# Patient Record
Sex: Female | Born: 1950 | Race: White | Hispanic: No | Marital: Married | Smoking: Never smoker
Health system: Southern US, Community
[De-identification: ages and names within clinical notes are randomized; demographics above are authoritative.]

## PROBLEM LIST (undated history)

## (undated) HISTORY — PX: ABDOMINAL HYSTERECTOMY: SUR658

---

## 2020-06-14 ENCOUNTER — Encounter: Payer: Self-pay | Admitting: Osteopathic Medicine

## 2020-06-14 ENCOUNTER — Other Ambulatory Visit: Payer: Self-pay

## 2020-06-14 ENCOUNTER — Ambulatory Visit (INDEPENDENT_AMBULATORY_CARE_PROVIDER_SITE_OTHER): Payer: MEDICARE | Admitting: Osteopathic Medicine

## 2020-06-14 VITALS — BP 120/64 | HR 59 | Temp 98.2°F | Ht 63.0 in | Wt 231.1 lb

## 2020-06-14 DIAGNOSIS — E039 Hypothyroidism, unspecified: Secondary | ICD-10-CM | POA: Diagnosis not present

## 2020-06-14 DIAGNOSIS — Z8673 Personal history of transient ischemic attack (TIA), and cerebral infarction without residual deficits: Secondary | ICD-10-CM | POA: Insufficient documentation

## 2020-06-14 DIAGNOSIS — Z794 Long term (current) use of insulin: Secondary | ICD-10-CM

## 2020-06-14 DIAGNOSIS — E119 Type 2 diabetes mellitus without complications: Secondary | ICD-10-CM

## 2020-06-14 DIAGNOSIS — R053 Chronic cough: Secondary | ICD-10-CM

## 2020-06-14 DIAGNOSIS — E1169 Type 2 diabetes mellitus with other specified complication: Secondary | ICD-10-CM

## 2020-06-14 DIAGNOSIS — R05 Cough: Secondary | ICD-10-CM

## 2020-06-14 DIAGNOSIS — E1122 Type 2 diabetes mellitus with diabetic chronic kidney disease: Secondary | ICD-10-CM

## 2020-06-14 DIAGNOSIS — Z90710 Acquired absence of both cervix and uterus: Secondary | ICD-10-CM

## 2020-06-14 DIAGNOSIS — K219 Gastro-esophageal reflux disease without esophagitis: Secondary | ICD-10-CM | POA: Diagnosis not present

## 2020-06-14 DIAGNOSIS — L578 Other skin changes due to chronic exposure to nonionizing radiation: Secondary | ICD-10-CM

## 2020-06-14 DIAGNOSIS — Z1231 Encounter for screening mammogram for malignant neoplasm of breast: Secondary | ICD-10-CM

## 2020-06-14 DIAGNOSIS — R5382 Chronic fatigue, unspecified: Secondary | ICD-10-CM | POA: Insufficient documentation

## 2020-06-14 DIAGNOSIS — I10 Essential (primary) hypertension: Secondary | ICD-10-CM

## 2020-06-14 DIAGNOSIS — E785 Hyperlipidemia, unspecified: Secondary | ICD-10-CM

## 2020-06-14 DIAGNOSIS — R239 Unspecified skin changes: Secondary | ICD-10-CM

## 2020-06-14 HISTORY — DX: Gastro-esophageal reflux disease without esophagitis: K21.9

## 2020-06-14 HISTORY — DX: Essential (primary) hypertension: I10

## 2020-06-14 HISTORY — DX: Hypothyroidism, unspecified: E03.9

## 2020-06-14 HISTORY — DX: Type 2 diabetes mellitus without complications: E11.9

## 2020-06-14 HISTORY — DX: Acquired absence of both cervix and uterus: Z90.710

## 2020-06-14 HISTORY — DX: Type 2 diabetes mellitus with other specified complication: E11.69

## 2020-06-14 LAB — CBC
HCT: 41.7 % (ref 35.0–45.0)
Hemoglobin: 13.7 g/dL (ref 11.7–15.5)
MCH: 30 pg (ref 27.0–33.0)
MCHC: 32.9 g/dL (ref 32.0–36.0)
MCV: 91.4 fL (ref 80.0–100.0)
MPV: 9.6 fL (ref 7.5–12.5)
Platelets: 290 10*3/uL (ref 140–400)
RBC: 4.56 10*6/uL (ref 3.80–5.10)
RDW: 13.4 % (ref 11.0–15.0)
WBC: 6.8 10*3/uL (ref 3.8–10.8)

## 2020-06-14 LAB — HEMOGLOBIN A1C
Hgb A1c MFr Bld: 5.6 % of total Hgb (ref <5.7)
Mean Plasma Glucose: 114 (calc)
eAG (mmol/L): 6.3 (calc)

## 2020-06-14 LAB — COMPLETE METABOLIC PANEL WITH GFR
AG Ratio: 1.3 (calc) (ref 1.0–2.5)
ALT: 18 U/L (ref 6–29)
AST: 15 U/L (ref 10–35)
Albumin: 3.8 g/dL (ref 3.6–5.1)
Alkaline phosphatase (APISO): 110 U/L (ref 37–153)
BUN/Creatinine Ratio: 16 (calc) (ref 6–22)
BUN: 31 mg/dL — ABNORMAL HIGH (ref 7–25)
CO2: 22 mmol/L (ref 20–32)
Calcium: 9.6 mg/dL (ref 8.6–10.4)
Chloride: 105 mmol/L (ref 98–110)
Creat: 1.91 mg/dL — ABNORMAL HIGH (ref 0.60–0.93)
GFR, Est African American: 30 mL/min/{1.73_m2} — ABNORMAL LOW (ref >=60)
GFR, Est Non African American: 26 mL/min/{1.73_m2} — ABNORMAL LOW (ref >=60)
Globulin: 2.9 g/dL (calc) (ref 1.9–3.7)
Glucose, Bld: 144 mg/dL — ABNORMAL HIGH (ref 65–139)
Potassium: 4.3 mmol/L (ref 3.5–5.3)
Sodium: 139 mmol/L (ref 135–146)
Total Bilirubin: 0.5 mg/dL (ref 0.2–1.2)
Total Protein: 6.7 g/dL (ref 6.1–8.1)

## 2020-06-14 LAB — VITAMIN D 25 HYDROXY (VIT D DEFICIENCY, FRACTURES): Vit D, 25-Hydroxy: 28 ng/mL — ABNORMAL LOW (ref 30–100)

## 2020-06-14 LAB — LIPID PANEL
Cholesterol: 112 mg/dL (ref <200)
HDL: 44 mg/dL — ABNORMAL LOW (ref >=50)
LDL Cholesterol (Calc): 50 mg/dL (calc)
Non-HDL Cholesterol (Calc): 68 mg/dL (calc) (ref <130)
Total CHOL/HDL Ratio: 2.5 (calc) (ref <5.0)
Triglycerides: 95 mg/dL (ref <150)

## 2020-06-14 LAB — PHOSPHORUS: Phosphorus: 3.6 mg/dL (ref 2.1–4.3)

## 2020-06-14 LAB — TSH: TSH: 1 mIU/L (ref 0.40–4.50)

## 2020-06-14 MED ORDER — OMEPRAZOLE 40 MG PO CPDR: 40.0000 mg | DELAYED_RELEASE_CAPSULE | Freq: Every day | ORAL | 0 refills | Status: DC

## 2020-06-14 NOTE — Progress Notes (Signed)
Marguerite Barba is a 69 y.o. female who presents to  [place] at [facility]  today, 06/14/20, seeking care for the following:  . Establish care  . Reports chronic conditions are stable, see below . Reports intermittent chronic dry cough for greater than 6 months, non-smoker, no history of occupational exposures to dust or fumes.  Does have some significant GERD symptoms bothering her from time to time but for the most part fairly well controlled, no allergies/postnasal drip problems. . Concern for some skin spots on both lower extremities.  Significant history of sun exposure. Marland Kitchen Pending records from previous PCP      ASSESSMENT & PLAN with other pertinent findings:  The primary encounter diagnosis was Essential hypertension. Diagnoses of Hyperlipidemia associated with type 2 diabetes mellitus ([place]), Gastroesophageal reflux disease, unspecified whether esophagitis present, Type 2 diabetes mellitus with other specified complication, with long-term current use of insulin ([place]), Hypothyroidism, unspecified type, Persistent dry cough, Skin change, Chronic fatigue, History of hysterectomy for benign disease, History of ischemic stroke, Type 2 diabetes mellitus with chronic kidney disease, with long-term current use of insulin, unspecified CKD stage ([place]), Sun-damaged skin, and Breast cancer screening by mammogram were also pertinent to this visit.       Patient Instructions  Plan: --> labs today to monitor kidneys, sugars, and to evaluate potential causes of fatigue --> if labs all ok, might consider repeating sleep study to assess for possible sleep apnea  --> adding Omeprazole antacid to take for 6-8 weeks to see if this helps the cough. Lungs sound great, but if cough no better, we should get further lung testing --> will get set up for routine mammogram --> referral to dermatology for skin check. If they can't get you in within the next month or  so, we can do biopsy here in the office for the concerning spots on the lower leg       Orders Placed This Encounter  Procedures  . MM 3D SCREEN BREAST BILATERAL  . CBC  . COMPLETE METABOLIC PANEL WITH GFR  . Lipid panel  . TSH  . Hemoglobin A1c  . Urinalysis, Routine w reflex microscopic  . VITAMIN D 25 Hydroxy (Vit-D Deficiency, Fractures)  . Phosphorus  . Ambulatory referral to Dermatology    Meds ordered this encounter  Medications  . omeprazole (PRILOSEC) 40 MG capsule    Sig: Take 1 capsule (40 mg total) by mouth daily.    Dispense:  90 capsule    Refill:  0       Follow-up instructions: Return in about 6 months (around 12/15/2020) for ROUTINE CHECK-UP AND LABS. SEE ME SOONER IF COUGH NOT BETTER, OR IF DERMATOLOGY UNABLE TO SEE YOU.                                         BP 120/64 (BP Location: Left Arm, Patient Position: Sitting, Cuff Size: Large)   Pulse (!) 59   Temp 98.2 F (36.8 C) (Oral)   Ht 5\' 3"  (1.6 m)   Wt 231 lb 1.9 oz (104.8 kg)   SpO2 98%   BMI 40.94 kg/m   Current Meds  Medication Sig  . amLODipine (NORVASC) 10 MG tablet Take 10 mg by mouth daily.  Marland Kitchen aspirin (ASPIRIN ADULT LOW DOSE) 81 MG EC tablet Take 81 mg by mouth daily. Swallow whole.  Marland Kitchen  atorvastatin (LIPITOR) 80 MG tablet Take 80 mg by mouth daily.  . clopidogrel (PLAVIX) 75 MG tablet Take 75 mg by mouth daily.  . famotidine (PEPCID) 20 MG tablet Take 20 mg by mouth 2 (two) times daily.  . hydrALAZINE (APRESOLINE) 50 MG tablet Take 50 mg by mouth 3 (three) times daily.  . insulin glargine, 2 Unit Dial, (TOUJEO MAX SOLOSTAR) 300 UNIT/ML Solostar Pen Inject 50 Units into the skin.  Marland Kitchen insulin lispro (HUMALOG) 100 UNIT/ML injection Inject into the skin 3 (three) times daily before meals.  Marland Kitchen levothyroxine (SYNTHROID) 137 MCG tablet Take 137 mcg by mouth daily before breakfast.  . lisinopril (ZESTRIL) 40 MG tablet Take 40 mg by mouth daily.  .  metoprolol tartrate (LOPRESSOR) 50 MG tablet Take 50 mg by mouth 2 (two) times daily.  . pioglitazone (ACTOS) 15 MG tablet Take 15 mg by mouth daily.  Marland Kitchen spironolactone (ALDACTONE) 50 MG tablet Take 50 mg by mouth daily.    No results found for this or any previous visit (from the past 72 hour(s)).  No results found.     All questions at time of visit were answered - patient instructed to contact office with any additional concerns or updates.  ER/RTC precautions were reviewed with the patient as applicable.   Please note: voice recognition software was used to produce this document, and typos may escape review. Please contact Dr. Sheppard Coil for any needed clarifications.   Total time spent 60 mins

## 2020-06-14 NOTE — Patient Instructions (Signed)
Plan: --> labs today to monitor kidneys, sugars, and to evaluate potential causes of fatigue --> if labs all ok, might consider repeating sleep study to assess for possible sleep apnea  --> adding Omeprazole antacid to take for 6-8 weeks to see if this helps the cough. Lungs sound great, but if cough no better, we should get further lung testing --> will get set up for routine mammogram --> referral to dermatology for skin check. If they can't get you in within the next month or so, we can do biopsy here in the office for the concerning spots on the lower leg

## 2020-06-16 LAB — URINALYSIS, ROUTINE W REFLEX MICROSCOPIC
Bilirubin Urine: NEGATIVE
Glucose, UA: NEGATIVE
Hgb urine dipstick: NEGATIVE
Ketones, ur: NEGATIVE
Nitrite: POSITIVE — AB
Protein, ur: NEGATIVE
Specific Gravity, Urine: 1.016 (ref 1.001–1.03)
pH: 5.5 (ref 5.0–8.0)

## 2020-06-16 MED ORDER — NITROFURANTOIN MONOHYD MACRO 100 MG PO CAPS: 100.0000 mg | ORAL_CAPSULE | Freq: Two times a day (BID) | ORAL | 0 refills | Status: DC

## 2020-06-16 NOTE — Addendum Note (Signed)
Addended by: Maryla Morrow on: 06/16/2020 02:01 PM   Modules accepted: Orders

## 2020-06-21 ENCOUNTER — Encounter: Payer: Self-pay | Admitting: Osteopathic Medicine

## 2020-06-22 NOTE — Telephone Encounter (Signed)
Adriana Gibson looking into this the patient told Derm that Dr. Loni Muse would do the biopsy for them so as of now it doesn't look like they are wanting referral sent anywhere else.  Then Nephrology referral has not been placed yet and I can not put referrals in. When it is ordered I will be happy to send it to where she wants. - [facility]

## 2020-06-29 ENCOUNTER — Ambulatory Visit (INDEPENDENT_AMBULATORY_CARE_PROVIDER_SITE_OTHER): Payer: MEDICARE

## 2020-06-29 ENCOUNTER — Other Ambulatory Visit: Payer: Self-pay

## 2020-06-29 DIAGNOSIS — Z1231 Encounter for screening mammogram for malignant neoplasm of breast: Secondary | ICD-10-CM

## 2020-07-24 ENCOUNTER — Other Ambulatory Visit: Payer: Self-pay

## 2020-07-24 DIAGNOSIS — E039 Hypothyroidism, unspecified: Secondary | ICD-10-CM

## 2020-07-24 MED ORDER — LEVOTHYROXINE SODIUM 137 MCG PO TABS: 137.0000 ug | ORAL_TABLET | Freq: Every day | ORAL | 3 refills | Status: DC

## 2020-07-25 ENCOUNTER — Other Ambulatory Visit: Payer: Self-pay | Admitting: Osteopathic Medicine

## 2020-07-25 DIAGNOSIS — R928 Other abnormal and inconclusive findings on diagnostic imaging of breast: Secondary | ICD-10-CM

## 2020-08-16 ENCOUNTER — Other Ambulatory Visit: Payer: Self-pay | Admitting: Osteopathic Medicine

## 2020-08-16 NOTE — Telephone Encounter (Signed)
Rx written by historical provider. 

## 2020-08-21 ENCOUNTER — Other Ambulatory Visit: Payer: Self-pay | Admitting: Osteopathic Medicine

## 2020-08-21 NOTE — Telephone Encounter (Signed)
[facility] requesting med refill for pioglitazone. Written by historical provider.

## 2020-09-05 ENCOUNTER — Other Ambulatory Visit: Payer: Self-pay | Admitting: Osteopathic Medicine

## 2020-09-13 ENCOUNTER — Telehealth: Payer: Self-pay

## 2020-09-13 NOTE — Telephone Encounter (Signed)
[facility] requesting med refills for spironolactone. Written by historical provider.

## 2020-09-15 MED ORDER — SPIRONOLACTONE 50 MG PO TABS: 50.0000 mg | ORAL_TABLET | Freq: Every day | ORAL | 1 refills | Status: DC

## 2020-09-17 DIAGNOSIS — E559 Vitamin D deficiency, unspecified: Secondary | ICD-10-CM | POA: Insufficient documentation

## 2020-09-17 DIAGNOSIS — N184 Chronic kidney disease, stage 4 (severe): Secondary | ICD-10-CM | POA: Insufficient documentation

## 2020-09-19 ENCOUNTER — Other Ambulatory Visit: Payer: Self-pay

## 2020-09-19 MED ORDER — TOUJEO MAX SOLOSTAR 300 UNIT/ML ~~LOC~~ SOPN: 50.0000 [IU] | PEN_INJECTOR | Freq: Every day | SUBCUTANEOUS | 5 refills | Status: DC

## 2020-09-19 MED ORDER — LISINOPRIL 40 MG PO TABS
40.0000 mg | ORAL_TABLET | Freq: Every day | ORAL | 3 refills | Status: DC
Start: 2020-09-19 — End: 2021-06-01

## 2020-09-19 NOTE — Telephone Encounter (Signed)
[facility] requesting med refill for lisinopril and toujeo. Both rxs written by historical provider. Rxs pended.

## 2020-09-30 ENCOUNTER — Ambulatory Visit
Admission: RE | Admit: 2020-09-30 | Discharge: 2020-09-30 | Disposition: A | Discharge status: Home / Self Care | Payer: MEDICARE | Source: Ambulatory Visit | Attending: Osteopathic Medicine | Admitting: Osteopathic Medicine

## 2020-09-30 ENCOUNTER — Other Ambulatory Visit: Payer: Self-pay

## 2020-09-30 ENCOUNTER — Other Ambulatory Visit: Payer: MEDICARE

## 2020-09-30 DIAGNOSIS — R928 Other abnormal and inconclusive findings on diagnostic imaging of breast: Secondary | ICD-10-CM

## 2020-10-26 ENCOUNTER — Other Ambulatory Visit: Payer: Self-pay | Admitting: Osteopathic Medicine

## 2020-10-30 ENCOUNTER — Ambulatory Visit (INDEPENDENT_AMBULATORY_CARE_PROVIDER_SITE_OTHER): Payer: MEDICARE | Admitting: Family Medicine

## 2020-10-30 DIAGNOSIS — Z0000 Encounter for general adult medical examination without abnormal findings: Secondary | ICD-10-CM | POA: Diagnosis not present

## 2020-10-30 NOTE — Patient Instructions (Addendum)
Bone Density Test The bone density test uses a special type of X-ray to measure the amount of calcium and other minerals in your bones. It can measure bone density in the hip and the spine. The test procedure is similar to having a regular X-ray. This test may also be called:  Bone densitometry.  Bone mineral density test.  Dual-energy X-ray absorptiometry (DEXA). You may have this test to:  Diagnose a condition that causes weak or thin bones (osteoporosis).  Screen you for osteoporosis.  Predict your risk for a broken bone (fracture).  Determine how well your osteoporosis treatment is working. Tell a health care provider about:  Any allergies you have.  All medicines you are taking, including vitamins, herbs, eye drops, creams, and over-the-counter medicines.  Any problems you or family members have had with anesthetic medicines.  Any blood disorders you have.  Any surgeries you have had.  Any medical conditions you have.  Whether you are pregnant or may be pregnant.  Any medical tests you have had within the past 14 days that used contrast material. What are the risks? Generally, this is a safe procedure. However, it does expose you to a small amount of radiation, which can slightly increase your cancer risk. What happens before the procedure?  Do not take any calcium supplements starting 24 hours before your test.  Remove all metal jewelry, eyeglasses, dental appliances, and any other metal objects. What happens during the procedure?   You will lie down on an exam table. There will be an X-ray generator below you and an imaging device above you.  Other devices, such as boxes or braces, may be used to position your body properly for the scan.  The machine will slowly scan your body. You will need to keep still.  The images will show up on a screen in the room. Images will be examined by a specialist after your test is done. The procedure may vary among health  care providers and hospitals. What happens after the procedure?  It is up to you to get your test results. Ask your health care provider, or the department that is doing the test, when your results will be ready. Summary  A bone density test is an imaging test that uses a type of X-ray to measure the amount of calcium and other minerals in your bones.  The test may be used to diagnose or screen you for a condition that causes weak or thin bones (osteoporosis), predict your risk for a broken bone (fracture), or determine how well your osteoporosis treatment is working.  Do not take any calcium supplements starting 24 hours before your test.  Ask your health care provider, or the department that is doing the test, when your results will be ready. This information is not intended to replace advice given to you by your health care provider. Make sure you discuss any questions you have with your health care provider. Document Revised: 10/30/2017 Document Reviewed: 08/18/2017 Elsevier Patient Education  [place].   Colonoscopy, Adult A colonoscopy is a procedure to look at the entire large intestine. This procedure is done using a long, thin, flexible tube that has a camera on the end. You may have a colonoscopy:  As a part of normal colorectal screening.  If you have certain symptoms, such as: ? A low number of red blood cells in your blood (anemia). ? Diarrhea that does not go away. ? Pain in your abdomen. ? Blood in  your stool. A colonoscopy can help screen for and diagnose medical problems, including:  Tumors.  Extra tissue that grows where mucus forms (polyps).  Inflammation.  Areas of bleeding. Tell your health care provider about:  Any allergies you have.  All medicines you are taking, including vitamins, herbs, eye drops, creams, and over-the-counter medicines.  Any problems you or family members have had with anesthetic medicines.  Any blood disorders you  have.  Any surgeries you have had.  Any medical conditions you have.  Any problems you have had with having bowel movements.  Whether you are pregnant or may be pregnant. What are the risks? Generally, this is a safe procedure. However, problems may occur, including:  Bleeding.  Damage to your intestine.  Allergic reactions to medicines given during the procedure.  Infection. This is rare. What happens before the procedure? Eating and drinking restrictions Follow instructions from your health care provider about eating or drinking restrictions, which may include:  A few days before the procedure: ? Follow a low-fiber diet. ? Avoid nuts, seeds, dried fruit, raw fruits, and vegetables.  1-3 days before the procedure: ? Eat only gelatin dessert or ice pops. ? Drink only clear liquids, such as water, clear juice, clear broth or bouillon, black coffee or tea, or clear soft drinks or sports drinks. ? Avoid liquids that contain red or purple dye.  The day of the procedure: ? Do not eat solid foods. You may continue to drink clear liquids until up to 2 hours before the procedure. ? Do not eat or drink anything starting 2 hours before the procedure, or within the time period that your health care provider recommends. Bowel prep If you were prescribed a bowel prep to take by mouth (orally) to clean out your colon:  Take it as told by your health care provider. Starting the day before your procedure, you will need to drink a large amount of liquid medicine. The liquid will cause you to have many bowel movements of loose stool until your stool becomes almost clear or light green.  If your skin or the opening between the buttocks (anus) gets irritated from diarrhea, you may relieve the irritation using: ? Wipes with medicine in them, such as adult wet wipes with aloe and vitamin E. ? A product to soothe skin, such as petroleum jelly.  If you vomit while drinking the bowel prep: ? Take  a break for up to 60 minutes. ? Begin the bowel prep again. ? Call your health care provider if you keep vomiting or you cannot take the bowel prep without vomiting.  To clean out your colon, you may also be given: ? Laxative medicines. These help you have a bowel movement. ? Instructions for enema use. An enema is liquid medicine injected into your rectum. Medicines Ask your health care provider about:  Changing or stopping your regular medicines or supplements. This is especially important if you are taking iron supplements, diabetes medicines, or blood thinners.  Taking medicines such as aspirin and ibuprofen. These medicines can thin your blood. Do not take these medicines unless your health care provider tells you to take them.  Taking over-the-counter medicines, vitamins, herbs, and supplements. General instructions  Ask your health care provider what steps will be taken to help prevent infection. These may include washing skin with a germ-killing soap.  Plan to have someone take you home from the hospital or clinic. What happens during the procedure?   An IV will be inserted  into one of your veins.  You may be given one or more of the following: ? A medicine to help you relax (sedative). ? A medicine to numb the area (local anesthetic). ? A medicine to make you fall asleep (general anesthetic). This is rarely needed.  You will lie on your side with your knees bent.  The tube will: ? Have oil or gel put on it (be lubricated). ? Be inserted into your anus. ? Be gently eased through all parts of your large intestine.  Air will be sent into your colon to keep it open. This may cause some pressure or cramping.  Images will be taken with the camera and will appear on a screen.  A small tissue sample may be removed to be looked at under a microscope (biopsy). The tissue may be sent to a lab for testing if any signs of problems are found.  If small polyps are found, they may  be removed and checked for cancer cells.  When the procedure is finished, the tube will be removed. The procedure may vary among health care providers and hospitals. What happens after the procedure?  Your blood pressure, heart rate, breathing rate, and blood oxygen level will be monitored until you leave the hospital or clinic.  You may have a small amount of blood in your stool.  You may pass gas and have mild cramping or bloating in your abdomen. This is caused by the air that was used to open your colon during the exam.  Do not drive for 24 hours after the procedure.  It is up to you to get the results of your procedure. Ask your health care provider, or the department that is doing the procedure, when your results will be ready. Summary  A colonoscopy is a procedure to look at the entire large intestine.  Follow instructions from your health care provider about eating and drinking before the procedure.  If you were prescribed an oral bowel prep to clean out your colon, take it as told by your health care provider.  During the colonoscopy, a flexible tube with a camera on its end is inserted into the anus and then passed into the other parts of the large intestine. This information is not intended to replace advice given to you by your health care provider. Make sure you discuss any questions you have with your health care provider. Document Revised: 05/07/2019 Document Reviewed: 05/07/2019 Elsevier Patient Education  [place] and Cholesterol Restricted Eating Plan Getting too much fat and cholesterol in your diet may cause health problems. Choosing the right foods helps keep your fat and cholesterol at normal levels. This can keep you from getting certain diseases. Your doctor may recommend an eating plan that includes:  Total fat: ______% or less of total calories a day.  Saturated fat: ______% or less of total calories a day.  Cholesterol: less than  _________mg a day.  Fiber: ______g a day. What are tips for following this plan? Meal planning  At meals, divide your plate into four equal parts: ? Fill one-half of your plate with vegetables and green salads. ? Fill one-fourth of your plate with whole grains. ? Fill one-fourth of your plate with low-fat (lean) protein foods.  Eat fish that is high in omega-3 fats at least two times a week. This includes mackerel, tuna, sardines, and salmon.  Eat foods that are high in fiber, such as whole grains, beans, apples, broccoli, carrots,  peas, and barley. General tips   Work with your doctor to lose weight if you need to.  Avoid: ? Foods with added sugar. ? Fried foods. ? Foods with partially hydrogenated oils.  Limit alcohol intake to no more than 1 drink a day for nonpregnant women and 2 drinks a day for men. One drink equals 12 oz of beer, 5 oz of wine, or 1 oz of hard liquor. Reading food labels  Check food labels for: ? Trans fats. ? Partially hydrogenated oils. ? Saturated fat (g) in each serving. ? Cholesterol (mg) in each serving. ? Fiber (g) in each serving.  Choose foods with healthy fats, such as: ? Monounsaturated fats. ? Polyunsaturated fats. ? Omega-3 fats.  Choose grain products that have whole grains. Look for the word "whole" as the first word in the ingredient list. Cooking  Cook foods using low-fat methods. These include baking, boiling, grilling, and broiling.  Eat more home-cooked foods. Eat at restaurants and buffets less often.  Avoid cooking using saturated fats, such as butter, cream, palm oil, palm kernel oil, and coconut oil. Recommended foods  Fruits  All fresh, canned (in natural juice), or frozen fruits. Vegetables  Fresh or frozen vegetables (raw, steamed, roasted, or grilled). Green salads. Grains  Whole grains, such as whole wheat or whole grain breads, crackers, cereals, and pasta. Unsweetened oatmeal, bulgur, barley, quinoa, or  brown rice. Corn or whole wheat flour tortillas. Meats and other protein foods  Ground beef (85% or leaner), grass-fed beef, or beef trimmed of fat. Skinless chicken or [place]. Ground chicken or [place]. Pork trimmed of fat. All fish and seafood. Egg whites. Dried beans, peas, or lentils. Unsalted nuts or seeds. Unsalted canned beans. Nut butters without added sugar or oil. Dairy  Low-fat or nonfat dairy products, such as skim or 1% milk, 2% or reduced-fat cheeses, low-fat and fat-free ricotta or cottage cheese, or plain low-fat and nonfat yogurt. Fats and oils  Tub margarine without trans fats. Light or reduced-fat mayonnaise and salad dressings. Avocado. Olive, canola, sesame, or safflower oils. The items listed above may not be a complete list of foods and beverages you can eat. Contact a dietitian for more information. Foods to avoid Fruits  Canned fruit in heavy syrup. Fruit in cream or butter sauce. Fried fruit. Vegetables  Vegetables cooked in cheese, cream, or butter sauce. Fried vegetables. Grains  White bread. White pasta. White rice. Cornbread. Bagels, pastries, and croissants. Crackers and snack foods that contain trans fat and hydrogenated oils. Meats and other protein foods  Fatty cuts of meat. Ribs, chicken wings, bacon, sausage, bologna, salami, chitterlings, fatback, hot dogs, bratwurst, and packaged lunch meats. Liver and organ meats. Whole eggs and egg yolks. Chicken and [place] with skin. Fried meat. Dairy  Whole or 2% milk, cream, half-and-half, and cream cheese. Whole milk cheeses. Whole-fat or sweetened yogurt. Full-fat cheeses. Nondairy creamers and whipped toppings. Processed cheese, cheese spreads, and cheese curds. Beverages  Alcohol. Sugar-sweetened drinks such as sodas, lemonade, and fruit drinks. Fats and oils  Butter, stick margarine, lard, shortening, ghee, or bacon fat. Coconut, palm kernel, and palm oils. Sweets and desserts  Corn syrup, sugars,  honey, and molasses. Candy. Jam and jelly. Syrup. Sweetened cereals. Cookies, pies, cakes, donuts, muffins, and ice cream. The items listed above may not be a complete list of foods and beverages you should avoid. Contact a dietitian for more information. Summary  Choosing the right foods helps keep your fat and cholesterol at normal  levels. This can keep you from getting certain diseases.  At meals, fill one-half of your plate with vegetables and green salads.  Eat high-fiber foods, like whole grains, beans, apples, carrots, peas, and barley.  Limit added sugar, saturated fats, alcohol, and fried foods. This information is not intended to replace advice given to you by your health care provider. Make sure you discuss any questions you have with your health care provider. Document Revised: 06/17/2018 Document Reviewed: 07/01/2017 Elsevier Patient Education  [place].   Diabetes Mellitus and [place] care is an important part of your health, especially when you have diabetes. Diabetes may cause you to have problems because of poor blood flow (circulation) to your feet and legs, which can cause your skin to:  Become thinner and drier.  Break more easily.  Heal more slowly.  Peel and crack. You may also have nerve damage (neuropathy) in your legs and feet, causing decreased feeling in them. This means that you may not notice minor injuries to your feet that could lead to more serious problems. Noticing and addressing any potential problems early is the best way to prevent future foot problems. How to care for your feet Foot hygiene  Wash your feet daily with warm water and mild soap. Do not use hot water. Then, pat your feet and the areas between your toes until they are completely dry. Do not soak your feet as this can dry your skin.  Trim your toenails straight across. Do not dig under them or around the cuticle. File the edges of your nails with an emery board or nail  file.  Apply a moisturizing lotion or petroleum jelly to the skin on your feet and to dry, brittle toenails. Use lotion that does not contain alcohol and is unscented. Do not apply lotion between your toes. Shoes and socks  Wear clean socks or stockings every day. Make sure they are not too tight. Do not wear knee-high stockings since they may decrease blood flow to your legs.  Wear shoes that fit properly and have enough cushioning. Always look in your shoes before you put them on to be sure there are no objects inside.  To break in new shoes, wear them for just a few hours a day. This prevents injuries on your feet. Wounds, scrapes, corns, and calluses  Check your feet daily for blisters, cuts, bruises, sores, and redness. If you cannot see the bottom of your feet, use a mirror or ask someone for help.  Do not cut corns or calluses or try to remove them with medicine.  If you find a minor scrape, cut, or break in the skin on your feet, keep it and the skin around it clean and dry. You may clean these areas with mild soap and water. Do not clean the area with peroxide, alcohol, or iodine.  If you have a wound, scrape, corn, or callus on your foot, look at it several times a day to make sure it is healing and not infected. Check for: ? Redness, swelling, or pain. ? Fluid or blood. ? Warmth. ? Pus or a bad smell. General instructions  Do not cross your legs. This may decrease blood flow to your feet.  Do not use heating pads or hot water bottles on your feet. They may burn your skin. If you have lost feeling in your feet or legs, you may not know this is happening until it is too late.  Protect your feet from  hot and cold by wearing shoes, such as at the beach or on hot pavement.  Schedule a complete foot exam at least once a year (annually) or more often if you have foot problems. If you have foot problems, report any cuts, sores, or bruises to your health care provider  immediately. Contact a health care provider if:  You have a medical condition that increases your risk of infection and you have any cuts, sores, or bruises on your feet.  You have an injury that is not healing.  You have redness on your legs or feet.  You feel burning or tingling in your legs or feet.  You have pain or cramps in your legs and feet.  Your legs or feet are numb.  Your feet always feel cold.  You have pain around a toenail. Get help right away if:  You have a wound, scrape, corn, or callus on your foot and: ? You have pain, swelling, or redness that gets worse. ? You have fluid or blood coming from the wound, scrape, corn, or callus. ? Your wound, scrape, corn, or callus feels warm to the touch. ? You have pus or a bad smell coming from the wound, scrape, corn, or callus. ? You have a fever. ? You have a red line going up your leg. Summary  Check your feet every day for cuts, sores, red spots, swelling, and blisters.  Moisturize feet and legs daily.  Wear shoes that fit properly and have enough cushioning.  If you have foot problems, report any cuts, sores, or bruises to your health care provider immediately.  Schedule a complete foot exam at least once a year (annually) or more often if you have foot problems. This information is not intended to replace advice given to you by your health care provider. Make sure you discuss any questions you have with your health care provider. Document Revised: 07/07/2019 Document Reviewed: 11/15/2016 Elsevier Patient Education  [place] Maintenance, Female Adopting a healthy lifestyle and getting preventive care are important in promoting health and wellness. Ask your health care provider about:  The right schedule for you to have regular tests and exams.  Things you can do on your own to prevent diseases and keep yourself healthy. What should I know about diet, weight, and exercise? Eat a  healthy diet   Eat a diet that includes plenty of vegetables, fruits, low-fat dairy products, and lean protein.  Do not eat a lot of foods that are high in solid fats, added sugars, or sodium. Maintain a healthy weight Body mass index (BMI) is used to identify weight problems. It estimates body fat based on height and weight. Your health care provider can help determine your BMI and help you achieve or maintain a healthy weight. Get regular exercise Get regular exercise. This is one of the most important things you can do for your health. Most adults should:  Exercise for at least 150 minutes each week. The exercise should increase your heart rate and make you sweat (moderate-intensity exercise).  Do strengthening exercises at least twice a week. This is in addition to the moderate-intensity exercise.  Spend less time sitting. Even light physical activity can be beneficial. Watch cholesterol and blood lipids Have your blood tested for lipids and cholesterol at 72 years of age, then have this test every 5 years. Have your cholesterol levels checked more often if:  Your lipid or cholesterol levels are high.  You are older  than 72 years of age.  You are at high risk for heart disease. What should I know about cancer screening? Depending on your health history and family history, you may need to have cancer screening at various ages. This may include screening for:  Breast cancer.  Cervical cancer.  Colorectal cancer.  Skin cancer.  Lung cancer. What should I know about heart disease, diabetes, and high blood pressure? Blood pressure and heart disease  High blood pressure causes heart disease and increases the risk of stroke. This is more likely to develop in people who have high blood pressure readings, are of African descent, or are overweight.  Have your blood pressure checked: ? Every 3-5 years if you are 92-82 years of age. ? Every year if you are 2 years old or  older. Diabetes Have regular diabetes screenings. This checks your fasting blood sugar level. Have the screening done:  Once every three years after age 72 if you are at a normal weight and have a low risk for diabetes.  More often and at a younger age if you are overweight or have a high risk for diabetes. What should I know about preventing infection? Hepatitis B If you have a higher risk for hepatitis B, you should be screened for this virus. Talk with your health care provider to find out if you are at risk for hepatitis B infection. Hepatitis C Testing is recommended for:  Everyone born from [birth year] through 1967.  Anyone with known risk factors for hepatitis C. Sexually transmitted infections (STIs)  Get screened for STIs, including gonorrhea and chlamydia, if: ? You are sexually active and are younger than 72 years of age. ? You are older than 72 years of age and your health care provider tells you that you are at risk for this type of infection. ? Your sexual activity has changed since you were last screened, and you are at increased risk for chlamydia or gonorrhea. Ask your health care provider if you are at risk.  Ask your health care provider about whether you are at high risk for HIV. Your health care provider may recommend a prescription medicine to help prevent HIV infection. If you choose to take medicine to prevent HIV, you should first get tested for HIV. You should then be tested every 3 months for as long as you are taking the medicine. Pregnancy  If you are about to stop having your period (premenopausal) and you may become pregnant, seek counseling before you get pregnant.  Take 400 to 800 micrograms (mcg) of folic acid every day if you become pregnant.  Ask for birth control (contraception) if you want to prevent pregnancy. Osteoporosis and menopause Osteoporosis is a disease in which the bones lose minerals and strength with aging. This can result in bone fractures.  If you are 31 years old or older, or if you are at risk for osteoporosis and fractures, ask your health care provider if you should:  Be screened for bone loss.  Take a calcium or vitamin D supplement to lower your risk of fractures.  Be given hormone replacement therapy (HRT) to treat symptoms of menopause. Follow these instructions at home: Lifestyle  Do not use any products that contain nicotine or tobacco, such as cigarettes, e-cigarettes, and chewing tobacco. If you need help quitting, ask your health care provider.  Do not use street drugs.  Do not share needles.  Ask your health care provider for help if you need support or information about  quitting drugs. Alcohol use  Do not drink alcohol if: ? Your health care provider tells you not to drink. ? You are pregnant, may be pregnant, or are planning to become pregnant.  If you drink alcohol: ? Limit how much you use to 0-1 drink a day. ? Limit intake if you are breastfeeding.  Be aware of how much alcohol is in your drink. In the [place] one drink equals one 12 oz bottle of beer (355 mL), one 5 oz glass of wine (148 mL), or one 1 oz glass of hard liquor (44 mL). General instructions  Schedule regular health, dental, and eye exams.  Stay current with your vaccines.  Tell your health care provider if: ? You often feel depressed. ? You have ever been abused or do not feel safe at home. Summary  Adopting a healthy lifestyle and getting preventive care are important in promoting health and wellness.  Follow your health care provider's instructions about healthy diet, exercising, and getting tested or screened for diseases.  Follow your health care provider's instructions on monitoring your cholesterol and blood pressure. This information is not intended to replace advice given to you by your health care provider. Make sure you discuss any questions you have with your health care provider. Document Revised: 10/07/2018  Document Reviewed: 10/07/2018 Elsevier Patient Education  2020 [place] Maintenance Summary and Written Plan of Care  Adriana Gibson ,  Thank you for allowing me to perform your Medicare Annual Wellness Visit and for your ongoing commitment to your health.   Health Maintenance & Immunization History Health Maintenance  Topic Date Due  . Hepatitis C Screening  11/26/2020 (Originally 03/31/1951)  . DEXA SCAN  11/27/2020 (Originally 12/30/2013)  . COLONOSCOPY (Pts 45-11yrs Insurance coverage will need to be confirmed)  11/27/2020 (Originally 12/30/1993)  . TETANUS/TDAP  11/27/2020 (Originally 12/31/1967)  . PNA vac Low Risk Adult (1 of 2 - PCV13) 11/27/2020 (Originally 12/30/2013)  . HEMOGLOBIN A1C  12/15/2020  . FOOT EXAM  09/27/2021  . OPHTHALMOLOGY EXAM  10/05/2021  . MAMMOGRAM  06/29/2022  . INFLUENZA VACCINE  Completed  . COVID-19 Vaccine  Completed   Immunization History  Administered Date(s) Administered  . Influenza-Unspecified 08/22/2020  . PFIZER SARS-COV-2 Vaccination 12/27/2019, 01/26/2020, 08/22/2020    These are the patient goals that we discussed: Goals Addressed            This Visit's Progress   . Patient Stated       10/30/2020 AWV Goal: Diabetes Management  . Patient will maintain an A1C level below 8.0 . Patient will not develop any diabetic foot complications . Patient will not experience any hypoglycemic episodes over the next 3 months . Patient will notify our office of any CBG readings outside of the provider recommended range by calling [phone] . Patient will adhere to provider recommendations for diabetes management  Patient Self Management Activities . take all medications as prescribed and report any negative side effects . monitor and record blood sugar readings as directed . adhere to a low carbohydrate diet that incorporates lean proteins, vegetables, whole grains, low glycemic fruits . check feet  daily noting any sores, cracks, injuries, or callous formations . see PCP or podiatrist if she notices any changes in her legs, feet, or toenails . Patient will visit PCP and have an A1C level checked every 3 to 6 months as directed  . have a yearly eye exam to monitor for vascular  changes associated with diabetes and will request that the report be sent to her pcp.  . consult with her PCP regarding any changes in her health or new or worsening symptoms         This is a list of Health Maintenance Items that are overdue or due now: Shingles vaccine, Pneumonia Vaccine, Tetanus shot, Dexa scan, need records from your last colonoscopy and mammogram.  Orders/Referrals Placed Today: No orders of the defined types were placed in this encounter.  Follow-up Plan . Follow-up with Emeterio Reeve, DO as planned . Schedule Shingles vaccine, Pneumonia Vaccine, Tetanus shot, Dexa scan, need records from your last colonoscopy and mammogram.

## 2020-10-30 NOTE — Progress Notes (Signed)
MEDICARE ANNUAL WELLNESS VISIT  10/30/2020  Telephone Visit Disclaimer This Medicare AWV was conducted by telephone due to national recommendations for restrictions regarding the COVID-19 Pandemic (e.g. social distancing).  I verified, using two identifiers, that I am speaking with Adriana Gibson or their authorized healthcare agent. I discussed the limitations, risks, security, and privacy concerns of performing an evaluation and management service by telephone and the potential availability of an in-person appointment in the future. The patient expressed understanding and agreed to proceed.  Location of Patient: Home Location of Provider (nurse):  In the office  Subjective:    Adriana Gibson is a 70 y.o. female patient of Emeterio Reeve, DO who had a Medicare Annual Wellness Visit today via telephone. Adriana Gibson is Retired and lives with their spouse. she has 4 children. she reports that she is socially active and does interact with friends/family regularly. she is minimally physically active and enjoys watching tv.  Patient Care Team: Emeterio Reeve, DO as PCP - General (Osteopathic Medicine)  Advanced Directives 10/30/2020  Does Patient Have a Medical Advance Directive? No  Would patient like information on creating a medical advance directive? No - Patient declined    Hospital Utilization Over the Past 12 Months: # of hospitalizations or ER visits: 0 # of surgeries: 0  Review of Systems    Patient reports that her overall health is unchanged compared to last year.  History obtained from chart review and the patient  Patient Reported Readings (BP, Pulse, CBG, Weight, etc) none  Pain Assessment Pain : No/denies pain     Current Medications & Allergies (verified) Allergies as of 10/30/2020   No Known Allergies     Medication List       Accurate as of October 30, 2020  2:41 PM. If you have any questions, ask your nurse or doctor.        amLODipine 10 MG  tablet Commonly known as: NORVASC TAKE 1 TABLET BY MOUTH EVERY DAY   aspirin 81 MG EC tablet Take 81 mg by mouth daily. Swallow whole.   atorvastatin 80 MG tablet Commonly known as: LIPITOR Take 80 mg by mouth daily.   BD Pen Needle Nano 2nd Gen 32G X 4 MM Misc Generic drug: Insulin Pen Needle   clopidogrel 75 MG tablet Commonly known as: PLAVIX Take 75 mg by mouth daily.   famotidine 20 MG tablet Commonly known as: PEPCID Take 20 mg by mouth 2 (two) times daily.   hydrALAZINE 50 MG tablet Commonly known as: APRESOLINE Take 50 mg by mouth 3 (three) times daily. Two times daily   insulin lispro 100 UNIT/ML injection Commonly known as: HUMALOG Inject into the skin 3 (three) times daily before meals. 17 units-dose change   HumaLOG KwikPen 200 UNIT/ML KwikPen Generic drug: insulin lispro   levothyroxine 137 MCG tablet Commonly known as: SYNTHROID Take 1 tablet (137 mcg total) by mouth daily before breakfast.   lisinopril 40 MG tablet Commonly known as: ZESTRIL Take 1 tablet (40 mg total) by mouth daily.   metoprolol tartrate 50 MG tablet Commonly known as: LOPRESSOR TAKE 1 TABLET BY MOUTH TWICE A DAY   nitrofurantoin (macrocrystal-monohydrate) 100 MG capsule Commonly known as: MACROBID Take 1 capsule (100 mg total) by mouth 2 (two) times daily.   omeprazole 40 MG capsule Commonly known as: PRILOSEC TAKE 1 CAPSULE BY MOUTH EVERY DAY   pioglitazone 15 MG tablet Commonly known as: ACTOS TAKE 1 TABLET BY MOUTH EVERY DAY   spironolactone  50 MG tablet Commonly known as: ALDACTONE Take 1 tablet (50 mg total) by mouth daily.   Toujeo Max SoloStar 300 UNIT/ML Solostar Pen Generic drug: insulin glargine (2 Unit Dial) Inject into the skin. What changed: Another medication with the same name was changed. Make sure you understand how and when to take each.   Toujeo Max SoloStar 300 UNIT/ML Solostar Pen Generic drug: insulin glargine (2 Unit Dial) Inject 50 Units  into the skin daily. What changed: additional instructions       History (reviewed): Past Medical History:  Diagnosis Date  . Diabetes mellitus ([place]) 06/14/2020  . Essential hypertension 06/14/2020  . Gastroesophageal reflux disease 06/14/2020  . History of hysterectomy for benign disease 06/14/2020  . Hyperlipidemia associated with type 2 diabetes mellitus ([place]) 06/14/2020  . Hypothyroidism 06/14/2020   Past Surgical History:  Procedure Laterality Date  . ABDOMINAL HYSTERECTOMY    . CESAREAN SECTION     Family History  Problem Relation Age of Onset  . Cancer Mother        throat  . Cancer Father   . Heart disease Brother    Social History   Socioeconomic History  . Marital status: Married    Spouse name: Mykaela Arena Sr.  . Number of children: 4  . Years of education: 80  . Highest education level: 12th grade  Occupational History  . Occupation: retired    Comment: worked at a Therapist, art  Tobacco Use  . Smoking status: Never Smoker  . Smokeless tobacco: Never Used  Vaping Use  . Vaping Use: Never used  Substance and Sexual Activity  . Alcohol use: Never  . Drug use: Never  . Sexual activity: Not Currently    Partners: Male  Other Topics Concern  . Not on file  Social History Narrative   Lives with her husband. Enjoys watching t.v.   Social Determinants of Health   Financial Resource Strain: Low Risk   . Difficulty of Paying Living Expenses: Not hard at all  Food Insecurity: No Food Insecurity  . Worried About Charity fundraiser in the Last Year: Never true  . Ran Out of Food in the Last Year: Never true  Transportation Needs: No Transportation Needs  . Lack of Transportation (Medical): No  . Lack of Transportation (Non-Medical): No  Physical Activity: Inactive  . Days of Exercise per Week: 0 days  . Minutes of Exercise per Session: 0 min  Stress: No Stress Concern Present  . Feeling of Stress : Not at all  Social Connections:  Moderately Isolated  . Frequency of Communication with Friends and Family: More than three times a week  . Frequency of Social Gatherings with Friends and Family: Never  . Attends Religious Services: Never  . Active Member of Clubs or Organizations: No  . Attends Archivist Meetings: Never  . Marital Status: Married    Activities of Daily Living In your present state of health, do you have any difficulty performing the following activities: 10/30/2020  Hearing? N  Vision? N  Difficulty concentrating or making decisions? N  Walking or climbing stairs? N  Dressing or bathing? N  Doing errands, shopping? Y  Comment goes with spouse  Conservation officer, nature and eating ? N  Using the Toilet? N  In the past six months, have you accidently leaked urine? N  Do you have problems with loss of bowel control? N  Managing your Medications? N  Managing your Finances? N  Housekeeping or managing your Housekeeping? N    Patient Education/ Literacy How often do you need to have someone help you when you read instructions, pamphlets, or other written materials from your doctor or pharmacy?: 1 - Never What is the last grade level you completed in school?: 12th grade  Exercise Current Exercise Habits: The patient does not participate in regular exercise at present, Exercise limited by: None identified  Diet Patient reports consuming 2 meals a day and 1 snack(s) a day Patient reports that her primary diet is: Regular Patient reports that she does have regular access to food.   Depression Screen PHQ 2/9 Scores 10/30/2020 06/14/2020  PHQ - 2 Score 2 1  PHQ- 9 Score 4 9     Fall Risk Fall Risk  10/30/2020 06/14/2020  Falls in the past year? 0 0  Number falls in past yr: 0 -  Injury with Fall? 0 -  Risk for fall due to : No Fall Risks -  Follow up Falls evaluation completed -     Objective:  Adriana Gibson seemed alert and oriented and she participated appropriately during our telephone  visit.  Blood Pressure Weight BMI  BP Readings from Last 3 Encounters:  06/14/20 120/64   Wt Readings from Last 3 Encounters:  06/14/20 231 lb 1.9 oz (104.8 kg)   BMI Readings from Last 1 Encounters:  06/14/20 40.94 kg/m    *Unable to obtain current vital signs, weight, and BMI due to telephone visit type  Hearing/Vision  . Adriana Gibson did not seem to have difficulty with hearing/understanding during the telephone conversation . Reports that she has had a formal eye exam by an eye care professional within the past year . Reports that she has not had a formal hearing evaluation within the past year *Unable to fully assess hearing and vision during telephone visit type  Cognitive Function: 6CIT Screen 10/30/2020  What Year? 0 points  What month? 0 points  What time? 0 points  Count back from 20 0 points  Months in reverse 0 points  Repeat phrase 2 points  Total Score 2   (Normal:0-7, Significant for Dysfunction: >8)  Normal Cognitive Function Screening: Yes   Immunization & Health Maintenance Record Immunization History  Administered Date(s) Administered  . Influenza-Unspecified 08/22/2020  . PFIZER SARS-COV-2 Vaccination 12/27/2019, 01/26/2020, 08/22/2020    Health Maintenance  Topic Date Due  . Hepatitis C Screening  11/26/2020 (Originally 02-Oct-1951)  . DEXA SCAN  11/27/2020 (Originally 12/30/2013)  . COLONOSCOPY (Pts 45-39yrs Insurance coverage will need to be confirmed)  11/27/2020 (Originally 12/30/1993)  . TETANUS/TDAP  11/27/2020 (Originally 12/31/1967)  . PNA vac Low Risk Adult (1 of 2 - PCV13) 11/27/2020 (Originally 12/30/2013)  . HEMOGLOBIN A1C  12/15/2020  . FOOT EXAM  09/27/2021  . OPHTHALMOLOGY EXAM  10/05/2021  . MAMMOGRAM  06/29/2022  . INFLUENZA VACCINE  Completed  . COVID-19 Vaccine  Completed       Assessment  This is a routine wellness examination for [facility].  Health Maintenance: Due or Overdue There are no preventive care reminders to display for  this patient.  Adriana Gibson does not need a referral for [employer] Assistance: Care Management:   not applicable Social Work:    not applicable Prescription Assistance:  not applicable Nutrition/Diabetes Education:  not applicable   Plan:  Personalized Goals Goals Addressed            This Visit's Progress   . Patient Stated  10/30/2020 AWV Goal: Diabetes Management  . Patient will maintain an A1C level below 8.0 . Patient will not develop any diabetic foot complications . Patient will not experience any hypoglycemic episodes over the next 3 months . Patient will notify our office of any CBG readings outside of the provider recommended range by calling [phone] . Patient will adhere to provider recommendations for diabetes management  Patient Self Management Activities . take all medications as prescribed and report any negative side effects . monitor and record blood sugar readings as directed . adhere to a low carbohydrate diet that incorporates lean proteins, vegetables, whole grains, low glycemic fruits . check feet daily noting any sores, cracks, injuries, or callous formations . see PCP or podiatrist if she notices any changes in her legs, feet, or toenails . Patient will visit PCP and have an A1C level checked every 3 to 6 months as directed  . have a yearly eye exam to monitor for vascular changes associated with diabetes and will request that the report be sent to her pcp.  . consult with her PCP regarding any changes in her health or new or worsening symptoms       Personalized Health Maintenance & Screening Recommendations  Pneumococcal vaccine  Td vaccine Screening mammography Bone densitometry screening Colorectal cancer screening  Lung Cancer Screening Recommended: no (Low Dose CT Chest recommended if Age 75-80 years, 30 pack-year currently smoking OR have quit w/in past 15 years) Hepatitis C Screening recommended: yes HIV Screening recommended:  yes  Advanced Directives: Written information was not prepared per patient's request.  Referrals & Orders No orders of the defined types were placed in this encounter.   Follow-up Plan . Follow-up with Emeterio Reeve, DO as planned . Schedule Shingles vaccine, Pneumonia Vaccine, Tetanus shot, Dexa scan, need records from your last colonoscopy and mammogram.     I have personally reviewed and noted the following in the patient's chart:   . Medical and social history . Use of alcohol, tobacco or illicit drugs  . Current medications and supplements . Functional ability and status . Nutritional status . Physical activity . Advanced directives . List of other physicians . Hospitalizations, surgeries, and ER visits in previous 12 months . Vitals . Screenings to include cognitive, depression, and falls . Referrals and appointments  In addition, I have reviewed and discussed with [facility] certain preventive protocols, quality metrics, and best practice recommendations. A written personalized care plan for preventive services as well as general preventive health recommendations is available and can be mailed to the patient at her request.      Tinnie Gens, RN  10/30/2020

## 2020-11-08 ENCOUNTER — Other Ambulatory Visit: Payer: Self-pay

## 2020-11-08 MED ORDER — CLOPIDOGREL BISULFATE 75 MG PO TABS
75.0000 mg | ORAL_TABLET | Freq: Every day | ORAL | 3 refills | Status: DC
Start: 2020-11-08 — End: 2021-06-01

## 2020-11-08 NOTE — Telephone Encounter (Signed)
[facility] pharmacy requesting med refill for clopidogrel. Rx written by historical provider. Rx pended.

## 2021-01-01 ENCOUNTER — Other Ambulatory Visit: Payer: Self-pay | Admitting: Osteopathic Medicine

## 2021-01-01 MED ORDER — AMLODIPINE BESYLATE 10 MG PO TABS
10.0000 mg | ORAL_TABLET | Freq: Every day | ORAL | 3 refills | Status: DC
Start: 2021-01-01 — End: 2021-06-01

## 2021-01-01 NOTE — Telephone Encounter (Signed)
[facility] pharmacy requesting med refill for amlodipine. Rx written by historical provider.

## 2021-03-02 ENCOUNTER — Other Ambulatory Visit: Payer: Self-pay | Admitting: Osteopathic Medicine

## 2021-03-10 ENCOUNTER — Other Ambulatory Visit: Payer: Self-pay | Admitting: Osteopathic Medicine

## 2021-04-05 ENCOUNTER — Other Ambulatory Visit: Payer: Self-pay | Admitting: Osteopathic Medicine

## 2021-04-14 ENCOUNTER — Other Ambulatory Visit: Payer: Self-pay | Admitting: Osteopathic Medicine

## 2021-04-20 ENCOUNTER — Other Ambulatory Visit: Payer: Self-pay | Admitting: Osteopathic Medicine

## 2021-05-08 ENCOUNTER — Other Ambulatory Visit: Payer: Self-pay | Admitting: Osteopathic Medicine

## 2021-05-10 ENCOUNTER — Other Ambulatory Visit: Payer: Self-pay | Admitting: Osteopathic Medicine

## 2021-05-17 ENCOUNTER — Other Ambulatory Visit: Payer: Self-pay | Admitting: Osteopathic Medicine

## 2021-06-01 ENCOUNTER — Ambulatory Visit (INDEPENDENT_AMBULATORY_CARE_PROVIDER_SITE_OTHER): Payer: MEDICARE | Admitting: Osteopathic Medicine

## 2021-06-01 ENCOUNTER — Encounter: Payer: Self-pay | Admitting: Osteopathic Medicine

## 2021-06-01 VITALS — BP 117/69 | HR 66 | Ht 63.0 in | Wt 235.0 lb

## 2021-06-01 DIAGNOSIS — Z794 Long term (current) use of insulin: Secondary | ICD-10-CM | POA: Diagnosis not present

## 2021-06-01 DIAGNOSIS — E1169 Type 2 diabetes mellitus with other specified complication: Secondary | ICD-10-CM | POA: Diagnosis not present

## 2021-06-01 DIAGNOSIS — E039 Hypothyroidism, unspecified: Secondary | ICD-10-CM

## 2021-06-01 DIAGNOSIS — Z8673 Personal history of transient ischemic attack (TIA), and cerebral infarction without residual deficits: Secondary | ICD-10-CM

## 2021-06-01 DIAGNOSIS — K219 Gastro-esophageal reflux disease without esophagitis: Secondary | ICD-10-CM

## 2021-06-01 DIAGNOSIS — N184 Chronic kidney disease, stage 4 (severe): Secondary | ICD-10-CM | POA: Diagnosis not present

## 2021-06-01 DIAGNOSIS — Z1231 Encounter for screening mammogram for malignant neoplasm of breast: Secondary | ICD-10-CM

## 2021-06-01 DIAGNOSIS — Z299 Encounter for prophylactic measures, unspecified: Secondary | ICD-10-CM

## 2021-06-01 DIAGNOSIS — I10 Essential (primary) hypertension: Secondary | ICD-10-CM | POA: Diagnosis not present

## 2021-06-01 MED ORDER — AMLODIPINE BESYLATE 10 MG PO TABS: 10.0000 mg | ORAL_TABLET | Freq: Every day | ORAL | 3 refills | Status: DC

## 2021-06-01 MED ORDER — FAMOTIDINE 20 MG PO TABS: 20.0000 mg | ORAL_TABLET | Freq: Every day | ORAL | 3 refills | Status: DC

## 2021-06-01 MED ORDER — CLOPIDOGREL BISULFATE 75 MG PO TABS: 75.0000 mg | ORAL_TABLET | Freq: Every day | ORAL | 3 refills | Status: DC

## 2021-06-01 MED ORDER — HUMALOG KWIKPEN 200 UNIT/ML ~~LOC~~ SOPN: PEN_INJECTOR | SUBCUTANEOUS | 3 refills | Status: AC

## 2021-06-01 MED ORDER — PIOGLITAZONE HCL 15 MG PO TABS: 15.0000 mg | ORAL_TABLET | Freq: Every day | ORAL | 3 refills | Status: AC

## 2021-06-01 MED ORDER — TOUJEO MAX SOLOSTAR 300 UNIT/ML ~~LOC~~ SOPN: 30.0000 [IU] | PEN_INJECTOR | Freq: Every day | SUBCUTANEOUS | 5 refills | Status: AC

## 2021-06-01 MED ORDER — SPIRONOLACTONE 50 MG PO TABS: 50.0000 mg | ORAL_TABLET | Freq: Every day | ORAL | 3 refills | Status: DC

## 2021-06-01 MED ORDER — BD PEN NEEDLE NANO 2ND GEN 32G X 4 MM MISC: 99 refills | Status: AC

## 2021-06-01 MED ORDER — ATORVASTATIN CALCIUM 80 MG PO TABS: 80.0000 mg | ORAL_TABLET | Freq: Every day | ORAL | 3 refills | Status: DC

## 2021-06-01 MED ORDER — OMEPRAZOLE 40 MG PO CPDR: 40.0000 mg | DELAYED_RELEASE_CAPSULE | Freq: Every day | ORAL | 3 refills | Status: DC

## 2021-06-01 MED ORDER — HYDRALAZINE HCL 50 MG PO TABS: 75.0000 mg | ORAL_TABLET | Freq: Two times a day (BID) | ORAL | 3 refills | Status: DC

## 2021-06-01 MED ORDER — LEVOTHYROXINE SODIUM 137 MCG PO TABS: 137.0000 ug | ORAL_TABLET | Freq: Every day | ORAL | 3 refills | Status: DC

## 2021-06-01 MED ORDER — METOPROLOL TARTRATE 50 MG PO TABS: 50.0000 mg | ORAL_TABLET | Freq: Two times a day (BID) | ORAL | 3 refills | Status: DC

## 2021-06-01 MED ORDER — LISINOPRIL 40 MG PO TABS: 40.0000 mg | ORAL_TABLET | Freq: Every day | ORAL | 3 refills | Status: DC

## 2021-06-01 NOTE — Patient Instructions (Addendum)
Recommend colon cancer screening if due   Mammogram ordered, call [phone]  Recommend vaccines: Tdap (tetanus) booster every 10 years, Shingles vaccine - ask pharmacy about these. COVID booster / 4th shot anytime.   Follow up with endocrinology - diabetes, insulin dose, starting Ozempic.

## 2021-06-01 NOTE — Progress Notes (Signed)
Adriana Gibson is a 70 y.o. female who presents to  [place] at [facility]  today, 06/01/21, seeking care for the following:  Medication refills  Chronic conditions - see below      [place] with other pertinent findings:  The primary encounter diagnosis was Type 2 diabetes mellitus with other specified complication, with long-term current use of insulin ([place]). Diagnoses of Hypothyroidism, unspecified type, Essential hypertension, Chronic kidney disease, stage 4 (severe) (HCC), Gastroesophageal reflux disease, unspecified whether esophagitis present, History of ischemic stroke, Breast cancer screening by mammogram, and Preventive measure were also pertinent to this visit.   1. Type 2 diabetes mellitus with other specified complication, with long-term current use of insulin ([place]) Refilled meds Following w/ endocrinology Husband asks about adding Ozempic, I would defer to endocrine on this   2. Hypothyroidism, unspecified type Following w/ endocrinology  3. Essential hypertension BP Readings from Last 3 Encounters:  06/01/21 117/69  06/14/20 120/64   4. Chronic kidney disease, stage 4 (severe) (HCC) Following w/ nephrlogy  5. Gastroesophageal reflux disease, unspecified whether esophagitis present Taking PPI and H2B, would check B12 and vitamin D levels next labs   6. History of ischemic stroke On Plavix, no s/s bleeding other than bruising  7. Breast cancer screening by mammogram ordered  8. Preventive measure Declined colon cancer screening  Advised Tdap, Shingrix Pt feels confident she has had pneumonia vaccine >65yo Mammo ordered DEXA declined for now  Advised 4th COVID shot     Patient Instructions  Recommend colon cancer screening if due   Mammogram ordered, call [phone]  Recommend vaccines: Tdap (tetanus) booster every 10 years, Shingles vaccine - ask pharmacy about these. COVID booster / 4th shot  anytime.   Follow up with endocrinology - diabetes, insulin dose, starting Ozempic.    Orders Placed This Encounter  Procedures   MM 3D SCREEN BREAST BILATERAL    Meds ordered this encounter  Medications   amLODipine (NORVASC) 10 MG tablet    Sig: Take 1 tablet (10 mg total) by mouth daily.    Dispense:  90 tablet    Refill:  3   spironolactone (ALDACTONE) 50 MG tablet    Sig: Take 1 tablet (50 mg total) by mouth daily.    Dispense:  90 tablet    Refill:  3   pioglitazone (ACTOS) 15 MG tablet    Sig: Take 1 tablet (15 mg total) by mouth daily.    Dispense:  90 tablet    Refill:  3   omeprazole (PRILOSEC) 40 MG capsule    Sig: Take 1 capsule (40 mg total) by mouth daily.    Dispense:  90 capsule    Refill:  3   metoprolol tartrate (LOPRESSOR) 50 MG tablet    Sig: Take 1 tablet (50 mg total) by mouth 2 (two) times daily.    Dispense:  180 tablet    Refill:  3   lisinopril (ZESTRIL) 40 MG tablet    Sig: Take 1 tablet (40 mg total) by mouth daily.    Dispense:  90 tablet    Refill:  3   levothyroxine (SYNTHROID) 137 MCG tablet    Sig: Take 1 tablet (137 mcg total) by mouth daily before breakfast.    Dispense:  90 tablet    Refill:  3   insulin lispro (HUMALOG KWIKPEN) 200 UNIT/ML KwikPen    Sig: INJECT 20-15-20-0 PLUS SCALE MDD:70  per endocrinology  Dispense:  24 mL    Refill:  3   insulin glargine, 2 Unit Dial, (TOUJEO MAX SOLOSTAR) 300 UNIT/ML Solostar Pen    Sig: Inject 30-100 Units into the skin daily. As directed by endocrinology    Dispense:  15 mL    Refill:  5   clopidogrel (PLAVIX) 75 MG tablet    Sig: Take 1 tablet (75 mg total) by mouth daily.    Dispense:  90 tablet    Refill:  3   Insulin Pen Needle (BD PEN NEEDLE NANO 2ND GEN) 32G X 4 MM MISC    Sig: As directed w/ insulin    Dispense:  400 each    Refill:  99   atorvastatin (LIPITOR) 80 MG tablet    Sig: Take 1 tablet (80 mg total) by mouth daily.    Dispense:  90 tablet    Refill:  3    hydrALAZINE (APRESOLINE) 50 MG tablet    Sig: Take 1.5 tablets (75 mg total) by mouth 2 (two) times daily. Two times daily    Dispense:  270 tablet    Refill:  3   famotidine (PEPCID) 20 MG tablet    Sig: Take 1 tablet (20 mg total) by mouth daily.    Dispense:  90 tablet    Refill:  3     See below for relevant physical exam findings  See below for recent lab and imaging results reviewed  Medications, allergies, PMH, PSH, SocH, FamH reviewed below    Follow-up instructions: Return in about 1 year (around 06/01/2022) for ROUTINE CHECK UP - CALL us FOR APPOINTMENT! .                                        Exam:  BP 117/69   Pulse 66   Ht 5\' 3"  (1.6 m)   Wt 235 lb (106.6 kg)   SpO2 97%   BMI 41.63 kg/m  Constitutional: VS see above. General Appearance: alert, well-developed, well-nourished, NAD Neck: No masses, trachea midline.  Respiratory: Normal respiratory effort. no wheeze, no rhonchi, no rales Cardiovascular: S1/S2 normal, no murmur, no rub/gallop auscultated. RRR.  Musculoskeletal: Gait normal. Symmetric and independent movement of all extremities Neurological: Normal balance/coordination. No tremor. Skin: warm, dry, intact.  Psychiatric: Normal judgment/insight. Normal mood and affect. Oriented x3.   No outpatient medications have been marked as taking for the 06/01/21 encounter (Office Visit) with Emeterio Reeve, DO.    No Known Allergies  Patient Active Problem List   Diagnosis Date Noted   Essential hypertension 06/14/2020   Hyperlipidemia associated with type 2 diabetes mellitus ([place]) 06/14/2020   Gastroesophageal reflux disease 06/14/2020   Diabetes mellitus ([place]) 06/14/2020   Hypothyroidism 06/14/2020   Persistent dry cough 06/14/2020   Skin change 06/14/2020   Chronic fatigue 06/14/2020   History of hysterectomy for benign disease 06/14/2020   History of ischemic stroke 06/14/2020    Family History  Problem  Relation Age of Onset   Cancer Mother        throat   Cancer Father    Heart disease Brother     Social History   Tobacco Use  Smoking Status Never  Smokeless Tobacco Never    Past Surgical History:  Procedure Laterality Date   ABDOMINAL HYSTERECTOMY     CESAREAN SECTION      Immunization History  Administered Date(s) Administered  Influenza-Unspecified 08/22/2020   PFIZER(Purple Top)SARS-COV-2 Vaccination 12/27/2019, 01/26/2020, 08/22/2020    No results found for this or any previous visit (from the past 2160 hour(s)).  No results found.     All questions at time of visit were answered - patient instructed to contact office with any additional concerns or updates. ER/RTC precautions were reviewed with the patient as applicable.   Please note: manual typing as well as voice recognition software may have been used to produce this document - typos may escape review. Please contact Dr. Sheppard Coil for any needed clarifications.   Total encounter time on date of service, 06/01/21, was 40 minutes spent addressing problems/issues as noted above in [place], including time spent in discussion with patient regarding the HPI, ROS, confirming history, reviewing Assessment & Plan, as well as time spent on coordination of care, record review.

## 2021-12-03 ENCOUNTER — Ambulatory Visit (INDEPENDENT_AMBULATORY_CARE_PROVIDER_SITE_OTHER): Payer: MEDICARE | Admitting: Family Medicine

## 2021-12-03 ENCOUNTER — Other Ambulatory Visit: Payer: Self-pay

## 2021-12-03 DIAGNOSIS — Z0000 Encounter for general adult medical examination without abnormal findings: Secondary | ICD-10-CM | POA: Diagnosis not present

## 2021-12-03 NOTE — Patient Instructions (Addendum)
[place] Maintenance Summary and Written Plan of Care  Ms. Matthies ,  Thank you for allowing me to perform your Medicare Annual Wellness Visit and for your ongoing commitment to your health.   Health Maintenance & Immunization History Health Maintenance  Topic Date Due   FOOT EXAM  12/05/2021 (Originally 09/27/2021)   HEMOGLOBIN A1C  12/05/2021 (Originally 12/15/2020)   Hepatitis C Screening  12/06/2021 (Originally 12/31/1966)   Zoster Vaccines- Shingrix (1 of 2) 03/02/2022 (Originally 12/31/1998)   DEXA SCAN  06/01/2022 (Originally 12/30/2013)   COLONOSCOPY (Pts 45-15yrs Insurance coverage will need to be confirmed)  06/01/2022 (Originally 12/30/1993)   Pneumonia Vaccine 43+ Years old (1 - PCV) 12/03/2022 (Originally 12[birth year]31/1954)   TETANUS/TDAP  12/03/2022 (Originally 12/31/1967)   MAMMOGRAM  06/29/2022   OPHTHALMOLOGY EXAM  10/04/2022   INFLUENZA VACCINE  Completed   COVID-19 Vaccine  Completed   HPV VACCINES  Aged Out   Immunization History  Administered Date(s) Administered   Influenza-Unspecified 08/22/2020, 10/11/2021   Moderna Covid-19 Vaccine Bivalent Booster 34yrs & up 10/11/2021   PFIZER(Purple Top)SARS-COV-2 Vaccination 12/27/2019, 01/26/2020, 08/22/2020    These are the patient goals that we discussed:  Goals Addressed               This Visit's Progress     Patient Stated (pt-stated)        12/03/2021 AWV Goal: Exercise for General Health  Patient will verbalize understanding of the benefits of increased physical activity: Exercising regularly is important. It will improve your overall fitness, flexibility, and endurance. Regular exercise also will improve your overall health. It can help you control your weight, reduce stress, and improve your bone density. Over the next year, patient will increase physical activity as tolerated with a goal of at least 150 minutes of moderate physical activity per week.  You can tell that you are exercising at  a moderate intensity if your heart starts beating faster and you start breathing faster but can still hold a conversation. Moderate-intensity exercise ideas include: Walking 1 mile (1.6 km) in about 15 minutes Biking Hiking Golfing Dancing Water aerobics Patient will verbalize understanding of everyday activities that increase physical activity by providing examples like the following: Yard work, such as: Sales promotion account executive Gardening Washing windows or floors Patient will be able to explain general safety guidelines for exercising:  Before you start a new exercise program, talk with your health care provider. Do not exercise so much that you hurt yourself, feel dizzy, or get very short of breath. Wear comfortable clothes and wear shoes with good support. Drink plenty of water while you exercise to prevent dehydration or heat stroke. Work out until your breathing and your heartbeat get faster.          This is a list of Health Maintenance Items that are overdue or due now: Pneumococcal vaccine  Influenza vaccine Td vaccine Screening mammography Bone densitometry screening Colorectal cancer screening Shingrix vaccine  Patient stated that she has [facility] of the [facility] and she will bring the records with her. She doesn't want to have a mammogram, bone density and colonoscopy.    Orders/Referrals Placed Today: No orders of the defined types were placed in this encounter.  (Contact our referral department at [phone] if you have not spoken with someone about your referral appointment within the next 5 days)    Follow-up Plan Follow-up with  Emeterio Reeve, DO as planned Schedule your tetanus and shingles vaccine at your pharmacy.  Let us know if you change your mind about the mammogram, bone density or colonoscopy. Medicare wellness visit in one year. Patient will access AVS on  mychart.      Health Maintenance, Female Adopting a healthy lifestyle and getting preventive care are important in promoting health and wellness. Ask your health care provider about: The right schedule for you to have regular tests and exams. Things you can do on your own to prevent diseases and keep yourself healthy. What should I know about diet, weight, and exercise? Eat a healthy diet  Eat a diet that includes plenty of vegetables, fruits, low-fat dairy products, and lean protein. Do not eat a lot of foods that are high in solid fats, added sugars, or sodium. Maintain a healthy weight Body mass index (BMI) is used to identify weight problems. It estimates body fat based on height and weight. Your health care provider can help determine your BMI and help you achieve or maintain a healthy weight. Get regular exercise Get regular exercise. This is one of the most important things you can do for your health. Most adults should: Exercise for at least 150 minutes each week. The exercise should increase your heart rate and make you sweat (moderate-intensity exercise). Do strengthening exercises at least twice a week. This is in addition to the moderate-intensity exercise. Spend less time sitting. Even light physical activity can be beneficial. Watch cholesterol and blood lipids Have your blood tested for lipids and cholesterol at 73 years of age, then have this test every 5 years. Have your cholesterol levels checked more often if: Your lipid or cholesterol levels are high. You are older than 73 years of age. You are at high risk for heart disease. What should I know about cancer screening? Depending on your health history and family history, you may need to have cancer screening at various ages. This may include screening for: Breast cancer. Cervical cancer. Colorectal cancer. Skin cancer. Lung cancer. What should I know about heart disease, diabetes, and high blood pressure? Blood  pressure and heart disease High blood pressure causes heart disease and increases the risk of stroke. This is more likely to develop in people who have high blood pressure readings or are overweight. Have your blood pressure checked: Every 3-5 years if you are 46-11 years of age. Every year if you are 85 years old or older. Diabetes Have regular diabetes screenings. This checks your fasting blood sugar level. Have the screening done: Once every three years after age 15 if you are at a normal weight and have a low risk for diabetes. More often and at a younger age if you are overweight or have a high risk for diabetes. What should I know about preventing infection? Hepatitis B If you have a higher risk for hepatitis B, you should be screened for this virus. Talk with your health care provider to find out if you are at risk for hepatitis B infection. Hepatitis C Testing is recommended for: Everyone born from [birth year] through 1967. Anyone with known risk factors for hepatitis C. Sexually transmitted infections (STIs) Get screened for STIs, including gonorrhea and chlamydia, if: You are sexually active and are younger than 73 years of age. You are older than 74 years of age and your health care provider tells you that you are at risk for this type of infection. Your sexual activity has changed since you were last  screened, and you are at increased risk for chlamydia or gonorrhea. Ask your health care provider if you are at risk. Ask your health care provider about whether you are at high risk for HIV. Your health care provider may recommend a prescription medicine to help prevent HIV infection. If you choose to take medicine to prevent HIV, you should first get tested for HIV. You should then be tested every 3 months for as long as you are taking the medicine. Pregnancy If you are about to stop having your period (premenopausal) and you may become pregnant, seek counseling before you get  pregnant. Take 400 to 800 micrograms (mcg) of folic acid every day if you become pregnant. Ask for birth control (contraception) if you want to prevent pregnancy. Osteoporosis and menopause Osteoporosis is a disease in which the bones lose minerals and strength with aging. This can result in bone fractures. If you are 57 years old or older, or if you are at risk for osteoporosis and fractures, ask your health care provider if you should: Be screened for bone loss. Take a calcium or vitamin D supplement to lower your risk of fractures. Be given hormone replacement therapy (HRT) to treat symptoms of menopause. Follow these instructions at home: Alcohol use Do not drink alcohol if: Your health care provider tells you not to drink. You are pregnant, may be pregnant, or are planning to become pregnant. If you drink alcohol: Limit how much you have to: 0-1 drink a day. Know how much alcohol is in your drink. In the [place] one drink equals one 12 oz bottle of beer (355 mL), one 5 oz glass of wine (148 mL), or one 1 oz glass of hard liquor (44 mL). Lifestyle Do not use any products that contain nicotine or tobacco. These products include cigarettes, chewing tobacco, and vaping devices, such as e-cigarettes. If you need help quitting, ask your health care provider. Do not use street drugs. Do not share needles. Ask your health care provider for help if you need support or information about quitting drugs. General instructions Schedule regular health, dental, and eye exams. Stay current with your vaccines. Tell your health care provider if: You often feel depressed. You have ever been abused or do not feel safe at home. Summary Adopting a healthy lifestyle and getting preventive care are important in promoting health and wellness. Follow your health care provider's instructions about healthy diet, exercising, and getting tested or screened for diseases. Follow your health care provider's  instructions on monitoring your cholesterol and blood pressure. This information is not intended to replace advice given to you by your health care provider. Make sure you discuss any questions you have with your health care provider. Document Revised: 03/05/2021 Document Reviewed: 03/05/2021 Elsevier Patient Education  [place].

## 2021-12-03 NOTE — Progress Notes (Addendum)
MEDICARE ANNUAL WELLNESS VISIT  12/03/2021  Telephone Visit Disclaimer This Medicare AWV was conducted by telephone due to national recommendations for restrictions regarding the COVID-19 Pandemic (e.g. social distancing).  I verified, using two identifiers, that I am speaking with Adriana Gibson or their authorized healthcare agent. I discussed the limitations, risks, security, and privacy concerns of performing an evaluation and management service by telephone and the potential availability of an in-person appointment in the future. The patient expressed understanding and agreed to proceed.  Location of Patient: Home Location of Provider (nurse):  In the office.  Subjective:    Adriana Gibson is a 71 y.o. female patient of Adriana Reeve, DO who had a Medicare Annual Wellness Visit today via telephone. Adriana Gibson is Retired and lives with their spouse. she has 4 children. she reports that she is socially active and does interact with friends/family regularly. she is minimally physically active and enjoys watching television.  Patient Care Team: Adriana Reeve, DO as PCP - General (Osteopathic Medicine)  Advanced Directives 12/03/2021 10/30/2020  Does Patient Have a Medical Advance Directive? No No  Would patient like information on creating a medical advance directive? No - Patient declined No - Patient declined    Hospital Utilization Over the Past 12 Months: # of hospitalizations or ER visits: 0 # of surgeries: 0  Review of Systems    Patient reports that her overall health is better compared to last year.  History obtained from chart review and the patient  Patient Reported Readings (BP, Pulse, CBG, Weight, etc) none  Pain Assessment Pain : No/denies pain     Current Medications & Allergies (verified) Allergies as of 12/03/2021   No Known Allergies      Medication List        Accurate as of December 03, 2021  2:26 PM. If you have any questions, ask your nurse or  doctor.          amLODipine 10 MG tablet Commonly known as: NORVASC Take 1 tablet (10 mg total) by mouth daily.   aspirin 81 MG EC tablet Take 81 mg by mouth daily. Swallow whole.   atorvastatin 80 MG tablet Commonly known as: LIPITOR Take 1 tablet (80 mg total) by mouth daily.   BD Pen Needle Nano 2nd Gen 32G X 4 MM Misc Generic drug: Insulin Pen Needle As directed w/ insulin   clopidogrel 75 MG tablet Commonly known as: PLAVIX Take 1 tablet (75 mg total) by mouth daily.   famotidine 20 MG tablet Commonly known as: PEPCID Take 1 tablet (20 mg total) by mouth daily.   HumaLOG KwikPen 200 UNIT/ML KwikPen Generic drug: insulin lispro INJECT 20-15-20-0 PLUS SCALE MDD:70  per endocrinology What changed: additional instructions   hydrALAZINE 50 MG tablet Commonly known as: APRESOLINE Take 1.5 tablets (75 mg total) by mouth 2 (two) times daily. Two times daily   levothyroxine 137 MCG tablet Commonly known as: SYNTHROID Take 1 tablet (137 mcg total) by mouth daily before breakfast.   lisinopril 40 MG tablet Commonly known as: ZESTRIL Take 1 tablet (40 mg total) by mouth daily.   metoprolol tartrate 50 MG tablet Commonly known as: LOPRESSOR Take 1 tablet (50 mg total) by mouth 2 (two) times daily.   omeprazole 40 MG capsule Commonly known as: PRILOSEC Take 1 capsule (40 mg total) by mouth daily.   OneTouch Verio test strip Generic drug: glucose blood 3 (three) times daily.   Ozempic (0.25 or 0.5 MG/DOSE) 2 MG/1.5ML Sopn  Generic drug: Semaglutide(0.25 or 0.5MG /DOS) Inject into the skin.   pioglitazone 15 MG tablet Commonly known as: ACTOS Take 1 tablet (15 mg total) by mouth daily.   spironolactone 50 MG tablet Commonly known as: ALDACTONE Take 1 tablet (50 mg total) by mouth daily.   Toujeo Max SoloStar 300 UNIT/ML Solostar Pen Generic drug: insulin glargine (2 Unit Dial) Inject 30-100 Units into the skin daily. As directed by endocrinology What  changed: additional instructions   Vitamin D3 50 MCG (2000 UT) capsule Take by mouth.        History (reviewed): Past Medical History:  Diagnosis Date   Diabetes mellitus ([place]) 06/14/2020   Essential hypertension 06/14/2020   Gastroesophageal reflux disease 06/14/2020   History of hysterectomy for benign disease 06/14/2020   Hyperlipidemia associated with type 2 diabetes mellitus ([place]) 06/14/2020   Hypothyroidism 06/14/2020   Past Surgical History:  Procedure Laterality Date   ABDOMINAL HYSTERECTOMY     CESAREAN SECTION     Family History  Problem Relation Age of Onset   Cancer Mother        throat   Cancer Father    Heart disease Brother    Social History   Socioeconomic History   Marital status: Married    Spouse name: Adriana Aull Sr.   Number of children: 4   Years of education: 12   Highest education level: 12th grade  Occupational History   Occupation: retired    Comment: worked at a Therapist, art  Tobacco Use   Smoking status: Never   Smokeless tobacco: Never  Scientific laboratory technician Use: Never used  Substance and Sexual Activity   Alcohol use: Never   Drug use: Never   Sexual activity: Not Currently    Partners: Male  Other Topics Concern   Not on file  Social History Narrative   Lives with her husband. Enjoys watching t.v.   Social Determinants of Health   Financial Resource Strain: Low Risk    Difficulty of Paying Living Expenses: Not hard at all  Food Insecurity: No Food Insecurity   Worried About Charity fundraiser in the Last Year: Never true   Arboriculturist in the Last Year: Never true  Transportation Needs: No Transportation Needs   Lack of Transportation (Medical): No   Lack of Transportation (Non-Medical): No  Physical Activity: Inactive   Days of Exercise per Week: 0 days   Minutes of Exercise per Session: 0 min  Stress: No Stress Concern Present   Feeling of Stress : Not at all  Social Connections: Moderately  Isolated   Frequency of Communication with Friends and Family: More than three times a week   Frequency of Social Gatherings with Friends and Family: Never   Attends Religious Services: Never   Marine scientist or Organizations: No   Attends Archivist Meetings: Never   Marital Status: Married    Activities of Daily Living In your present state of health, do you have any difficulty performing the following activities: 12/03/2021  Hearing? N  Vision? N  Difficulty concentrating or making decisions? N  Walking or climbing stairs? N  Dressing or bathing? Y  Comment has  little difficulty  Doing errands, shopping? N  Comment her husband helps with that.  Preparing Food and eating ? N  Comment eat out a lot.  Using the Toilet? N  In the past six months, have you accidently leaked urine? N  Do  you have problems with loss of bowel control? N  Managing your Medications? Y  Comment her husband helps with that.  Managing your Finances? Y  Comment her husband helps with that.  Housekeeping or managing your Housekeeping? Y  Comment her husband helps with that.  Some recent data might be hidden    Patient Education/ Literacy How often do you need to have someone help you when you read instructions, pamphlets, or other written materials from your doctor or pharmacy?: 1 - Never What is the last grade level you completed in school?: 12th grade  Exercise Current Exercise Habits: The patient does not participate in regular exercise at present, Exercise limited by: None identified  Diet Patient reports consuming 2 meals a day and 1-2 snack(s) a day Patient reports that her primary diet is: Regular Patient reports that she does have regular access to food.   Depression Screen PHQ 2/9 Scores 12/03/2021 10/30/2020 06/14/2020  PHQ - 2 Score 1 2 1   PHQ- 9 Score - 4 9     Fall Risk Fall Risk  12/03/2021 10/30/2020 06/14/2020  Falls in the past year? 1 0 0  Number falls in past yr: 0  0 -  Injury with Fall? 0 0 -  Risk for fall due to : History of fall(s) No Fall Risks -  Follow up Education provided;Falls prevention discussed;Falls evaluation completed Falls evaluation completed -     Objective:  Shaylen Nephew seemed alert and oriented and she participated appropriately during our telephone visit.  Blood Pressure Weight BMI  BP Readings from Last 3 Encounters:  06/01/21 117/69  06/14/20 120/64   Wt Readings from Last 3 Encounters:  06/01/21 235 lb (106.6 kg)  06/14/20 231 lb 1.9 oz (104.8 kg)   BMI Readings from Last 1 Encounters:  06/01/21 41.63 kg/m    *Unable to obtain current vital signs, weight, and BMI due to telephone visit type  Hearing/Vision  Lamira did not seem to have difficulty with hearing/understanding during the telephone conversation Reports that she has had a formal eye exam by an eye care professional within the past year Reports that she has not had a formal hearing evaluation within the past year *Unable to fully assess hearing and vision during telephone visit type  Cognitive Function: 6CIT Screen 12/03/2021 10/30/2020  What Year? 0 points 0 points  What month? 0 points 0 points  What time? 0 points 0 points  Count back from 20 0 points 0 points  Months in reverse 0 points 0 points  Repeat phrase 0 points 2 points  Total Score 0 2   (Normal:0-7, Significant for Dysfunction: >8)  Normal Cognitive Function Screening: Yes   Immunization & Health Maintenance Record Immunization History  Administered Date(s) Administered   Influenza-Unspecified 08/22/2020, 10/11/2021   Moderna Covid-19 Vaccine Bivalent Booster 18yrs & up 10/11/2021   PFIZER(Purple Top)SARS-COV-2 Vaccination 12/27/2019, 01/26/2020, 08/22/2020    Health Maintenance  Topic Date Due   FOOT EXAM  12/05/2021 (Originally 09/27/2021)   HEMOGLOBIN A1C  12/05/2021 (Originally 12/15/2020)   Hepatitis C Screening  12/06/2021 (Originally 12/31/1966)   Zoster Vaccines- Shingrix  (1 of 2) 03/02/2022 (Originally 12/31/1998)   DEXA SCAN  06/01/2022 (Originally 12/30/2013)   COLONOSCOPY (Pts 45-35yrs Insurance coverage will need to be confirmed)  06/01/2022 (Originally 12/30/1993)   Pneumonia Vaccine 92+ Years old (1 - PCV) 12/03/2022 (Originally 12/31/1954)   TETANUS/TDAP  12/03/2022 (Originally 12/31/1967)   MAMMOGRAM  06/29/2022   OPHTHALMOLOGY EXAM  10/04/2022   INFLUENZA  VACCINE  Completed   COVID-19 Vaccine  Completed   HPV VACCINES  Aged Out       Assessment  This is a routine wellness examination for [facility].  Health Maintenance: Due or Overdue There are no preventive care reminders to display for this patient.   Marlea Goodness does not need a referral for [employer] Assistance: Care Management:   no Social Work:    no Prescription Assistance:  no Nutrition/Diabetes Education:  no   Plan:  Personalized Goals  Goals Addressed               This Visit's Progress     Patient Stated (pt-stated)        12/03/2021 AWV Goal: Exercise for General Health  Patient will verbalize understanding of the benefits of increased physical activity: Exercising regularly is important. It will improve your overall fitness, flexibility, and endurance. Regular exercise also will improve your overall health. It can help you control your weight, reduce stress, and improve your bone density. Over the next year, patient will increase physical activity as tolerated with a goal of at least 150 minutes of moderate physical activity per week.  You can tell that you are exercising at a moderate intensity if your heart starts beating faster and you start breathing faster but can still hold a conversation. Moderate-intensity exercise ideas include: Walking 1 mile (1.6 km) in about 15 minutes Biking Hiking Golfing Dancing Water aerobics Patient will verbalize understanding of everyday activities that increase physical activity by providing examples like the following: Yard  work, such as: Sales promotion account executive Gardening Washing windows or floors Patient will be able to explain general safety guidelines for exercising:  Before you start a new exercise program, talk with your health care provider. Do not exercise so much that you hurt yourself, feel dizzy, or get very short of breath. Wear comfortable clothes and wear shoes with good support. Drink plenty of water while you exercise to prevent dehydration or heat stroke. Work out until your breathing and your heartbeat get faster.        Personalized Health Maintenance & Screening Recommendations  Pneumococcal vaccine  Influenza vaccine Td vaccine Screening mammography Bone densitometry screening Colorectal cancer screening Shingrix vaccine  Patient stated that she [facility] of [facility] and she will bring the records with her. She doesn't want to have a mammogram, bone density and colonoscopy.  Lung Cancer Screening Recommended: no (Low Dose CT Chest recommended if Age 49-80 years, 30 pack-year currently smoking OR have quit w/in past 15 years) Hepatitis C Screening recommended: yes HIV Screening recommended: no  Advanced Directives: Written information was not prepared per patient's request.  Referrals & Orders No orders of the defined types were placed in this encounter.   Follow-up Plan Follow-up with Adriana Reeve, DO as planned Schedule your tetanus and shingles vaccine at your pharmacy.  Let us know if you change your mind about the mammogram, bone density or colonoscopy. Medicare wellness visit in one year. Patient will access AVS on mychart.   I have personally reviewed and noted the following in the patients chart:   Medical and social history Use of alcohol, tobacco or illicit drugs  Current medications and supplements Functional ability and status Nutritional status Physical  activity Advanced directives List of other physicians Hospitalizations, surgeries, and ER visits in previous 12 months Vitals Screenings to include cognitive, depression, and  falls Referrals and appointments  In addition, I have reviewed and discussed with [facility] certain preventive protocols, quality metrics, and best practice recommendations. A written personalized care plan for preventive services as well as general preventive health recommendations is available and can be mailed to the patient at her request.      Tinnie Gens  12/03/2021

## 2022-06-04 ENCOUNTER — Encounter: Payer: Self-pay | Admitting: Family Medicine

## 2022-06-04 ENCOUNTER — Ambulatory Visit (INDEPENDENT_AMBULATORY_CARE_PROVIDER_SITE_OTHER): Payer: MEDICARE | Admitting: Family Medicine

## 2022-06-04 VITALS — BP 112/68 | HR 78 | Ht 63.0 in | Wt 207.0 lb

## 2022-06-04 DIAGNOSIS — B351 Tinea unguium: Secondary | ICD-10-CM | POA: Insufficient documentation

## 2022-06-04 DIAGNOSIS — E1142 Type 2 diabetes mellitus with diabetic polyneuropathy: Secondary | ICD-10-CM

## 2022-06-04 DIAGNOSIS — I10 Essential (primary) hypertension: Secondary | ICD-10-CM | POA: Diagnosis not present

## 2022-06-04 DIAGNOSIS — E039 Hypothyroidism, unspecified: Secondary | ICD-10-CM

## 2022-06-04 DIAGNOSIS — E1169 Type 2 diabetes mellitus with other specified complication: Secondary | ICD-10-CM | POA: Diagnosis not present

## 2022-06-04 DIAGNOSIS — Z794 Long term (current) use of insulin: Secondary | ICD-10-CM

## 2022-06-04 DIAGNOSIS — D485 Neoplasm of uncertain behavior of skin: Secondary | ICD-10-CM | POA: Diagnosis not present

## 2022-06-04 DIAGNOSIS — N184 Chronic kidney disease, stage 4 (severe): Secondary | ICD-10-CM

## 2022-06-04 DIAGNOSIS — E785 Hyperlipidemia, unspecified: Secondary | ICD-10-CM

## 2022-06-04 DIAGNOSIS — K219 Gastro-esophageal reflux disease without esophagitis: Secondary | ICD-10-CM

## 2022-06-04 MED ORDER — PANTOPRAZOLE SODIUM 40 MG PO TBEC: 40.0000 mg | DELAYED_RELEASE_TABLET | Freq: Every day | ORAL | 0 refills | Status: DC

## 2022-06-04 NOTE — Progress Notes (Signed)
Adriana Gibson - 71 y.o. female MRN [id number]  Date of birth: [birth year]  Subjective Chief Complaint  Patient presents with   Transitions Of Care   Diabetes    Adriana Gibson is a 71 year old female here today for follow-up visit.   She has history of diabetes is managed by endocrinology.  Sees Dr.Doerr with [facility].  Current management with Toujeo, Actos and Ozempic.  She is doing well with current medications.  She does have associated CKD.  She is followed by nephrology.  She reports blood sugars have been pretty well-controlled with current medications.  Blood pressure is treated with lisinopril, metoprolol and hydralazine.  Additionally she is on spironolactone.  Doing well with these.  Blood pressure stable at this time.  No side effects from medication.  Denies chest pain, shortness of breath, palpitations, headaches or vision changes.  She does have history of ischemic stroke.  Remains on Plavix and aspirin along with statin for secondary prevention.  She is concerned about lesion on her left leg.  No pain or bleeding associated with this.  Has had precancerous and cancerous lesions removed previously.  Would like referral back to dermatology.  \She has had persistent nighttime cough.  Does not really have cough during the daytime.  Has tried famotidine without much relief.  ROS:  A comprehensive ROS was completed and negative except as noted per HPI   No Known Allergies  Past Medical History:  Diagnosis Date   Diabetes mellitus ([place]) 06/14/2020   Essential hypertension 06/14/2020   Gastroesophageal reflux disease 06/14/2020   History of hysterectomy for benign disease 06/14/2020   Hyperlipidemia associated with type 2 diabetes mellitus ([place]) 06/14/2020   Hypothyroidism 06/14/2020    Past Surgical History:  Procedure Laterality Date   ABDOMINAL HYSTERECTOMY     CESAREAN SECTION      Social History   Socioeconomic History   Marital status: Married    Spouse name:  Adriana Rissler Sr.   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 Enjoys watching t.v.   Social Determinants of Health   Financial Resource Strain: Low Risk  (12/03/2021)   Overall Financial Resource Strain (CARDIA)    Difficulty of Paying Living Expenses: Not hard at all  Food Insecurity: No Food Insecurity (12/03/2021)   Hunger Vital Sign    Worried About Running Out of Food in the Last Year: Never true    [place] in the Last Year: Never true  Transportation Needs: No Transportation Needs (12/03/2021)   PRAPARE - Hydrologist (Medical): No    Lack of Transportation (Non-Medical): No  Physical Activity: Inactive (12/03/2021)   Exercise Vital Sign    Days of Exercise per Week: 0 days    Minutes of Exercise per Session: 0 min  Stress: No Stress Concern Present (12/03/2021)   [place]    Feeling of Stress : Not  at all  Social Connections: Moderately Isolated (12/03/2021)   Social Connection and Isolation Panel [NHANES]    Frequency of Communication with Friends and Family: More than three times a week    Frequency of Social Gatherings with Friends and Family: Never    Attends Religious Services: Never    Marine scientist or Organizations: No    Attends Music therapist: Never    Marital Status: Married    Family History  Problem Relation Age of Onset   Cancer Mother        throat   Cancer Father    Heart disease  Brother     Health Maintenance  Topic Date Due   INFLUENZA VACCINE  05/28/2022   Diabetic kidney evaluation - GFR measurement  06/28/2022 (Originally 06/14/2021)   HEMOGLOBIN A1C  06/28/2022 (Originally 12/15/2020)   Zoster Vaccines- Shingrix (1 of 2) 09/04/2022 (Originally 12/31/1967)   Pneumonia Vaccine 33+ Years old (1 - PCV) 12/03/2022 (Originally 12/30/2013)   TETANUS/TDAP  12/03/2022 (Originally 12/31/1967)   DEXA SCAN  06/05/2023 (Originally 12/30/2013)   COLONOSCOPY (Pts 45-35yr Insurance coverage will need to be confirmed)  06/05/2023 (Originally 12/30/1993)   Hepatitis C Screening  06/05/2023 (Originally 12/31/1966)   MAMMOGRAM  06/29/2022   OPHTHALMOLOGY EXAM  10/04/2022   Diabetic kidney evaluation - Urine ACR  01/10/2023   FOOT EXAM  06/05/2023   COVID-19 Vaccine  Completed   HPV VACCINES  Aged Out     ----------------------------------------------------------------------------------------------------------------------------------------------------------------------------------------------------------------- Physical Exam BP 112/68 (BP Location: Left Arm, Patient Position: Sitting, Cuff Size: Large)   Pulse 78   Ht '5\' 3"'$  (1.6 m)   Wt 207 lb (93.9 kg)   SpO2 94%   BMI 36.67 kg/m   Physical Exam Constitutional:      Appearance: Normal appearance.  Cardiovascular:     Rate and Rhythm: Normal rate and regular rhythm.  Musculoskeletal:     Cervical back: Neck supple.  Skin:    Comments: Keratin horn on left lower extremity.  Neurological:     General: No focal deficit present.     Mental Status: She is alert.  Psychiatric:        Mood and Affect: Mood normal.        Behavior: Behavior normal.     ------------------------------------------------------------------------------------------------------------------------------------------------------------------------------------------------------------------- Assessment and Plan  Essential hypertension Blood pressure  mains well-controlled at this time.  Recommend continuation of current medications.  Gastroesophageal reflux disease She is having chronic nighttime cough.  May be related to reflux.  Switching famotidine to pantoprazole.  Hyperlipidemia associated with type 2 diabetes mellitus ([place] Tolerating atorvastatin well.  Updated lipid panel ordered.  Diabetes mellitus ([place] She is following endocrinology and medications for management management this time.  Request records of her last A1c.  Chronic kidney disease, stage 4 (severe) (HCC) Followed by nephrology at this time.  Renal function stable.  Hypothyroidism She has been on a stable dose of levothyroxine.  Update TSH.  Onychomycosis Referral placed to podiatry for onychomycosis and diabetic footcare.  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  Referral placed to dermatology.   Meds ordered this encounter  Medications   pantoprazole (PROTONIX) 40 MG tablet    Sig: Take 1 tablet (40 mg total) by mouth daily.    Dispense:  30 tablet    Refill:  0    Return in about 6 months (around 12/05/2022) for HTN.    This visit occurred during the SARS-CoV-2  public health emergency.  Safety protocols were in place, including screening questions prior to the visit, additional usage of staff PPE, and extensive cleaning of exam room while observing appropriate contact time as indicated for disinfecting solutions.

## 2022-06-04 NOTE — Assessment & Plan Note (Signed)
Tolerating atorvastatin well.  Updated lipid panel ordered. 

## 2022-06-04 NOTE — Assessment & Plan Note (Signed)
She has been on a stable dose of levothyroxine.  Update TSH.

## 2022-06-04 NOTE — Assessment & Plan Note (Signed)
She is following endocrinology and medications for management management this time.  Request records of her last A1c.

## 2022-06-04 NOTE — Assessment & Plan Note (Signed)
Referral placed to podiatry for onychomycosis and diabetic footcare.

## 2022-06-04 NOTE — Assessment & Plan Note (Signed)
Followed by nephrology at this time.  Renal function stable.

## 2022-06-04 NOTE — Assessment & Plan Note (Signed)
She is having chronic nighttime cough.  May be related to reflux.  Switching famotidine to pantoprazole.

## 2022-06-04 NOTE — Assessment & Plan Note (Signed)
Blood pressure mains well-controlled at this time.  Recommend continuation of current medications.

## 2022-06-04 NOTE — Assessment & Plan Note (Signed)
Lesion on lower extremity concerning for squamous cell carcinoma.  Referral placed to dermatology.

## 2022-06-04 NOTE — Patient Instructions (Signed)
Try pantoprazole '40mg'$  daily to help with night time cough.  We'll be in touch with lab results.  You will be contacted by dermatology for appt.

## 2022-06-05 LAB — HEPATIC FUNCTION PANEL
AG Ratio: 1.3 (calc) (ref 1.0–2.5)
ALT: 17 U/L (ref 6–29)
AST: 15 U/L (ref 10–35)
Albumin: 3.6 g/dL (ref 3.6–5.1)
Alkaline phosphatase (APISO): 99 U/L (ref 37–153)
Bilirubin, Direct: 0.1 mg/dL (ref 0.0–0.2)
Globulin: 2.7 g/dL (ref 1.9–3.7)
Indirect Bilirubin: 0.4 mg/dL (ref 0.2–1.2)
Total Bilirubin: 0.5 mg/dL (ref 0.2–1.2)
Total Protein: 6.3 g/dL (ref 6.1–8.1)

## 2022-06-05 LAB — LIPID PANEL W/REFLEX DIRECT LDL
Cholesterol: 104 mg/dL (ref <200)
HDL: 37 mg/dL — ABNORMAL LOW (ref >=50)
LDL Cholesterol (Calc): 47 mg/dL (calc)
Non-HDL Cholesterol (Calc): 67 mg/dL (calc) (ref <130)
Total CHOL/HDL Ratio: 2.8 (calc) (ref <5.0)
Triglycerides: 113 mg/dL (ref <150)

## 2022-06-05 LAB — TSH: TSH: 0.16 mIU/L — ABNORMAL LOW (ref 0.40–4.50)

## 2022-06-07 ENCOUNTER — Other Ambulatory Visit: Payer: Self-pay | Admitting: Osteopathic Medicine

## 2022-06-07 DIAGNOSIS — E039 Hypothyroidism, unspecified: Secondary | ICD-10-CM

## 2022-06-08 ENCOUNTER — Other Ambulatory Visit: Payer: Self-pay | Admitting: Osteopathic Medicine

## 2022-06-12 ENCOUNTER — Other Ambulatory Visit: Payer: Self-pay | Admitting: Family Medicine

## 2022-06-12 DIAGNOSIS — E039 Hypothyroidism, unspecified: Secondary | ICD-10-CM

## 2022-06-12 MED ORDER — LEVOTHYROXINE SODIUM 125 MCG PO TABS: 125.0000 ug | ORAL_TABLET | Freq: Every day | ORAL | 3 refills | Status: DC

## 2022-06-13 ENCOUNTER — Ambulatory Visit (INDEPENDENT_AMBULATORY_CARE_PROVIDER_SITE_OTHER): Payer: MEDICARE | Admitting: Podiatry

## 2022-06-13 ENCOUNTER — Encounter: Payer: Self-pay | Admitting: Podiatry

## 2022-06-13 DIAGNOSIS — E1142 Type 2 diabetes mellitus with diabetic polyneuropathy: Secondary | ICD-10-CM

## 2022-06-13 DIAGNOSIS — M79674 Pain in right toe(s): Secondary | ICD-10-CM | POA: Diagnosis not present

## 2022-06-13 DIAGNOSIS — B351 Tinea unguium: Secondary | ICD-10-CM | POA: Diagnosis not present

## 2022-06-13 DIAGNOSIS — Z794 Long term (current) use of insulin: Secondary | ICD-10-CM

## 2022-06-13 DIAGNOSIS — M79675 Pain in left toe(s): Secondary | ICD-10-CM

## 2022-06-13 NOTE — Progress Notes (Signed)
  Subjective:  Patient ID: Adriana Gibson, female    DOB: Nov 28, 1950,   MRN: [id number]  Chief Complaint  Patient presents with   Nail Problem    diabetic with numbness on feet/Onychomycosis    71 y.o. female presents for concern of thickened elongated and painful nails that are difficult to trim. Requesting to have them trimmed today. Relates burning and tingling in their feet. Patient is diabetic and last A1c was  Lab Results  Component Value Date   HGBA1C 5.6 06/14/2020   .   PCP:  Luetta Nutting, DO    . Denies any other pedal complaints. Denies n/v/f/c.   Past Medical History:  Diagnosis Date   Diabetes mellitus ([place]) 06/14/2020   Essential hypertension 06/14/2020   Gastroesophageal reflux disease 06/14/2020   History of hysterectomy for benign disease 06/14/2020   Hyperlipidemia associated with type 2 diabetes mellitus ([place]) 06/14/2020   Hypothyroidism 06/14/2020    Objective:  Physical Exam: Vascular: DP/PT pulses 2/4 bilateral. CFT <3 seconds. Absent hair growth on digits. Edema noted to bilateral lower extremities. Xerosis noted bilaterally.  Skin. No lacerations or abrasions bilateral feet. Nails 1-5 bilateral  are thickened discolored and elongated with subungual debris.  Hyperkeratotic spiked lesion on anterior ankle with abnormal appearance, following with dermatology for removal.  Musculoskeletal: MMT 5/5 bilateral lower extremities in DF, PF, Inversion and Eversion. Deceased ROM in DF of ankle joint.  Neurological: Sensation intact to light touch. Protective sensation diminished bilateral.    Assessment:   1. Pain due to onychomycosis of toenails of both feet   2. Type 2 diabetes mellitus with diabetic polyneuropathy, with long-term current use of insulin ([place])      Plan:  Patient was evaluated and treated and all questions answered. -Discussed and educated patient on diabetic foot care, especially with  regards to the vascular, neurological and musculoskeletal  systems.  -Stressed the importance of good glycemic control and the detriment of not  controlling glucose levels in relation to the foot. -Discussed supportive shoes at all times and checking feet regularly.  -Mechanically debrided all nails 1-5 bilateral using sterile nail nipper and filed with dremel without incident  -Answered all patient questions -Patient to return  in 3 months for at risk foot care -Patient advised to call the office if any problems or questions arise in the meantime.   Lorenda Peck, DPM

## 2022-06-14 IMAGING — MG DIGITAL SCREENING BILAT W/ TOMO W/ CAD
8 series · 8 of 24 positions shown · non-contrast
Comparison: None.
COMPARISON: None.

Addendum:
CLINICAL DATA: Screening.

EXAM:
DIGITAL SCREENING BILATERAL MAMMOGRAM WITH TOMO AND CAD

[L CC synth-2D]
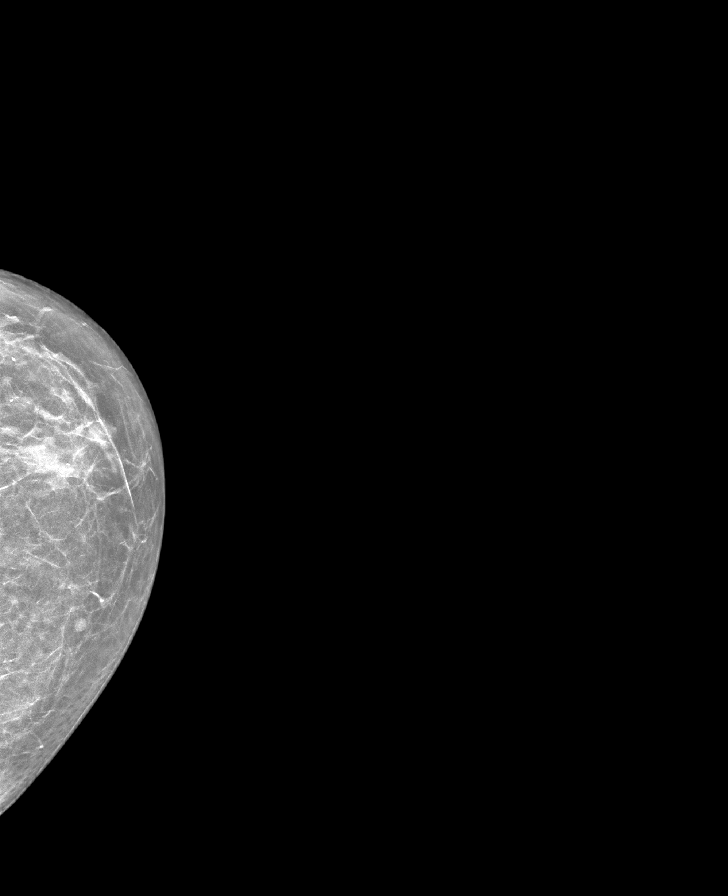

[R MLO synth-2D]
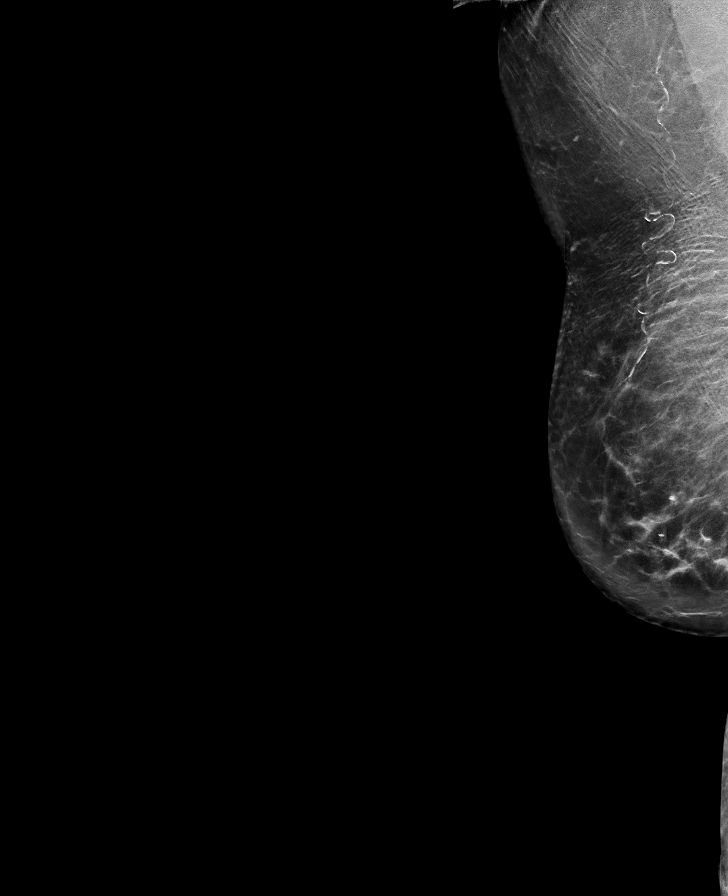

[L MLO synth-2D]
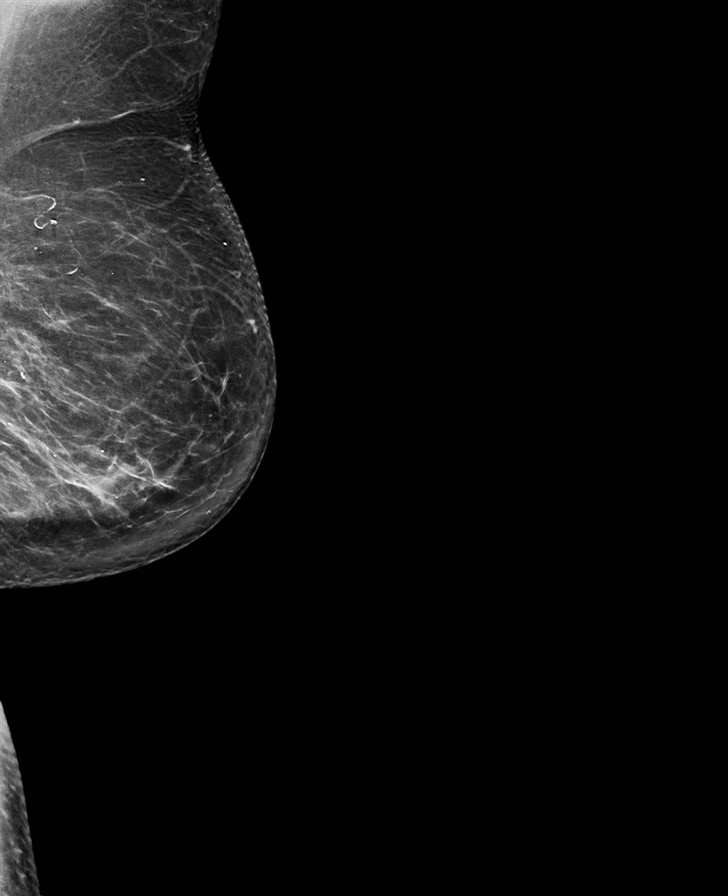

[R CC synth-2D]
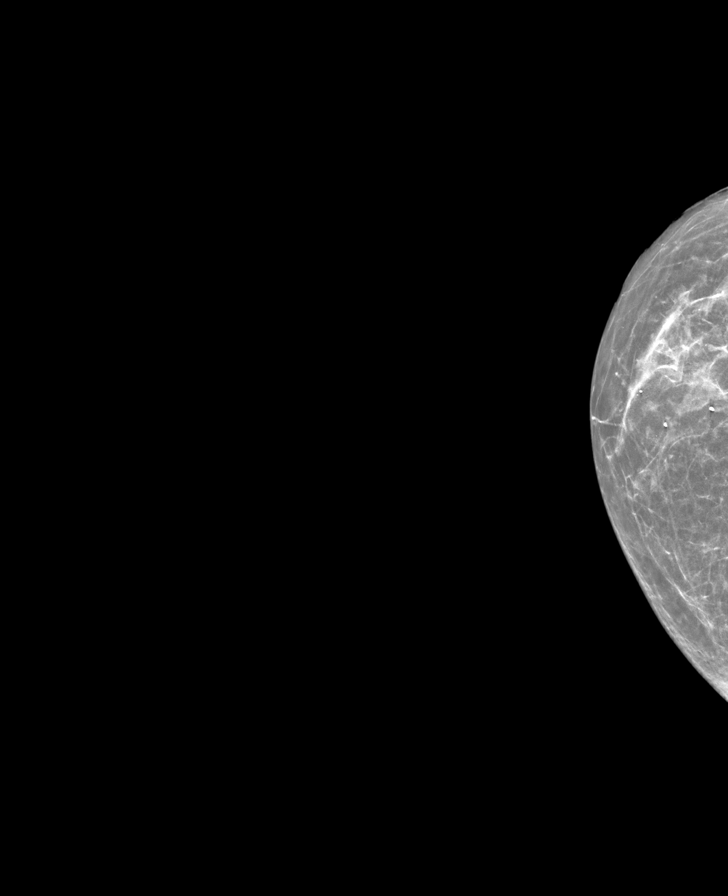

[L CC tomo · tomo slice 30/59.0]
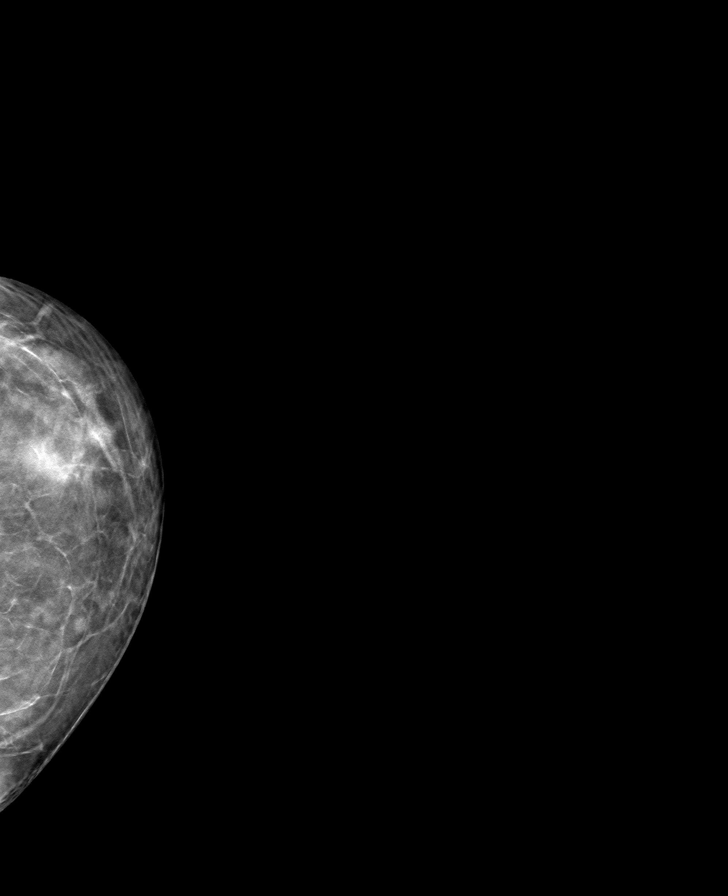

[L MLO tomo · tomo slice 43/84.0]
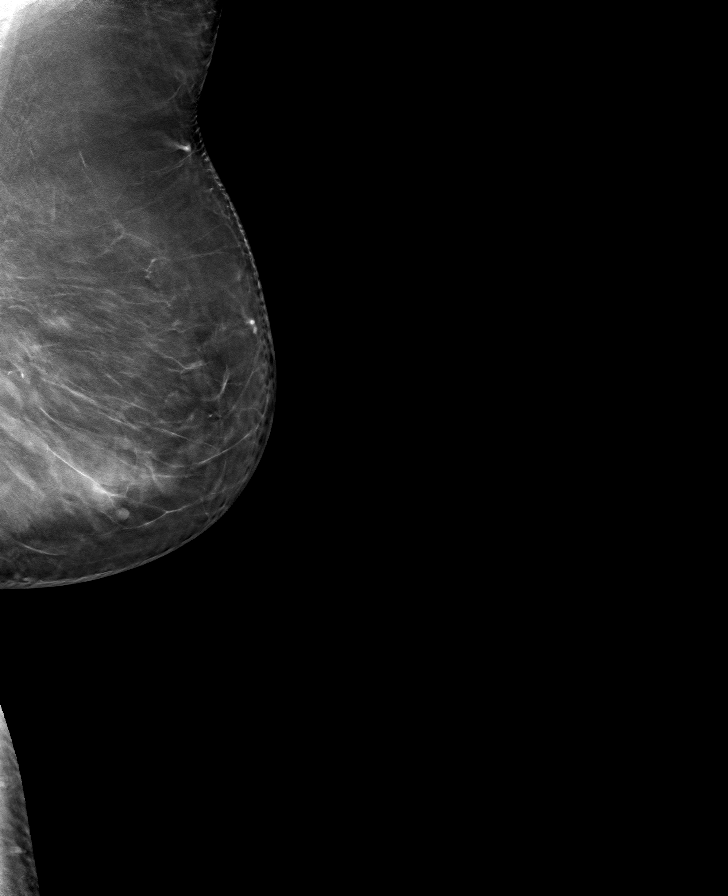

[R CC tomo · tomo slice 25/48.0]
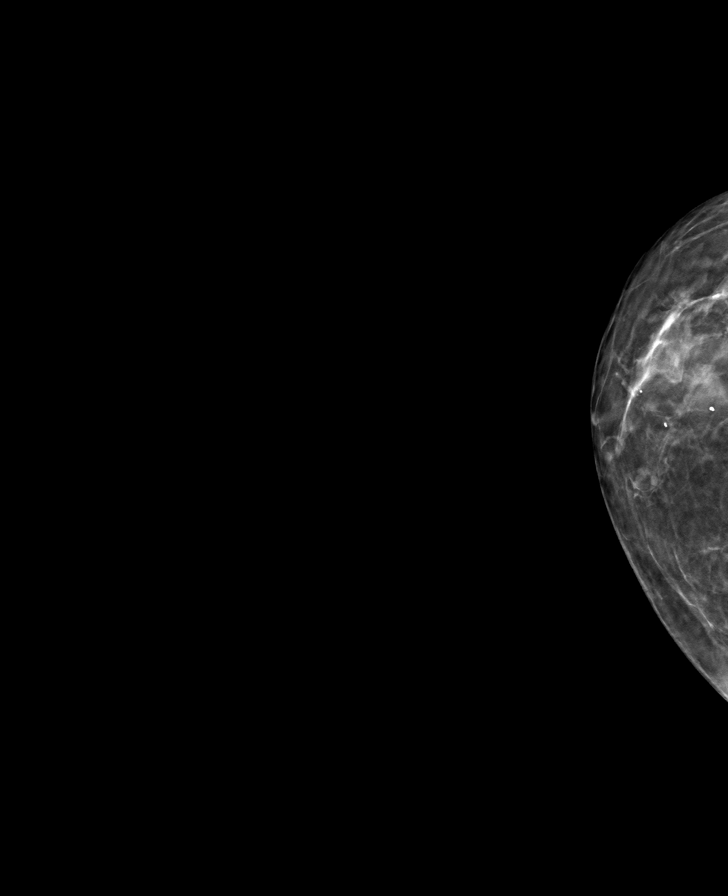

[R MLO tomo · tomo slice 43/85.0]
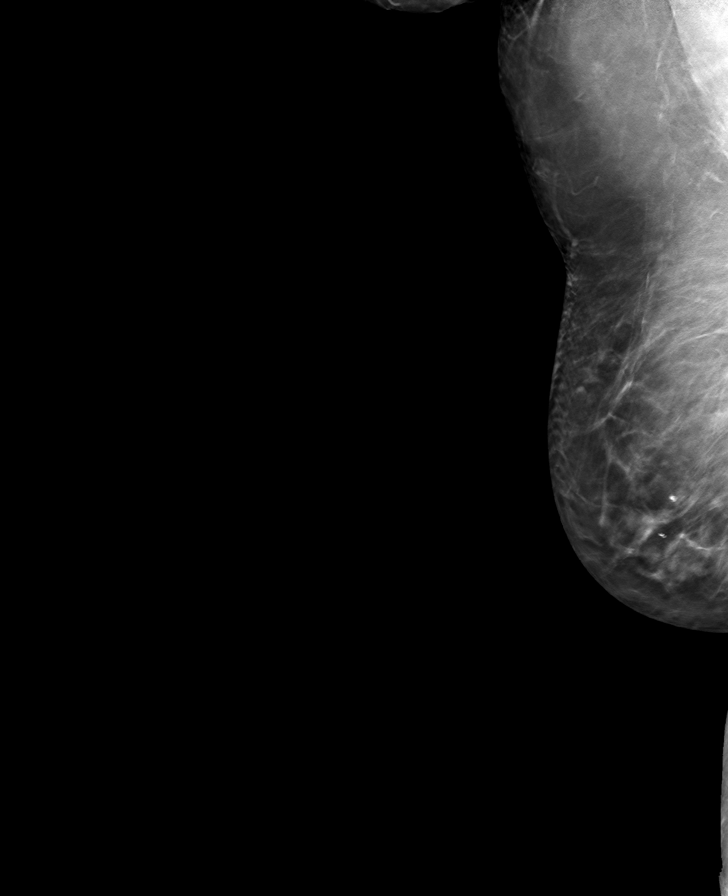

[8 of 24 positions shown; findings below may reference images not displayed]

ACR Breast Density Category b: There are scattered areas of
fibroglandular density.
FINDINGS: In the left breast, a possible mass warrants further evaluation. In
the right breast, no findings suspicious for malignancy. Images were
processed with CAD.
IMPRESSION: Further evaluation is suggested for possible mass in the left
breast.

RECOMMENDATION:
Ultrasound of the left breast. (Code:RV-2-CC4)

The patient will be contacted regarding the findings, and additional
imaging will be scheduled.

BI-RADS CATEGORY  0: Incomplete. Need additional imaging evaluation
and/or prior mammograms for comparison.

ADDENDUM:
Prior exams have become available for comparison, the most recent
dated 12/17/2017. The possible left breast mass appears new since
the prior exams. There is no change to the current impression,
recommendation or BI-RADS category.

*** End of Addendum ***
ACR Breast Density Category b: There are scattered areas of
fibroglandular density.
FINDINGS: In the left breast, a possible mass warrants further evaluation. In
the right breast, no findings suspicious for malignancy. Images were
processed with CAD.
IMPRESSION: Further evaluation is suggested for possible mass in the left
breast.

RECOMMENDATION:
Ultrasound of the left breast. (Code:RV-2-CC4)

The patient will be contacted regarding the findings, and additional
imaging will be scheduled.

BI-RADS CATEGORY  0: Incomplete. Need additional imaging evaluation
and/or prior mammograms for comparison.

## 2022-06-19 ENCOUNTER — Other Ambulatory Visit: Payer: Self-pay | Admitting: Osteopathic Medicine

## 2022-06-26 ENCOUNTER — Other Ambulatory Visit: Payer: Self-pay | Admitting: Osteopathic Medicine

## 2022-06-27 ENCOUNTER — Other Ambulatory Visit: Payer: Self-pay | Admitting: Family Medicine

## 2022-07-09 ENCOUNTER — Other Ambulatory Visit: Payer: Self-pay

## 2022-07-09 DIAGNOSIS — E1169 Type 2 diabetes mellitus with other specified complication: Secondary | ICD-10-CM

## 2022-07-09 MED ORDER — ATORVASTATIN CALCIUM 80 MG PO TABS: 80.0000 mg | ORAL_TABLET | Freq: Every day | ORAL | 3 refills | Status: DC

## 2022-08-10 ENCOUNTER — Other Ambulatory Visit: Payer: Self-pay | Admitting: Osteopathic Medicine

## 2022-08-12 ENCOUNTER — Other Ambulatory Visit: Payer: Self-pay

## 2022-08-12 MED ORDER — CLOPIDOGREL BISULFATE 75 MG PO TABS: 75.0000 mg | ORAL_TABLET | Freq: Every day | ORAL | 3 refills | Status: DC

## 2022-09-06 ENCOUNTER — Telehealth: Payer: Self-pay

## 2022-09-06 DIAGNOSIS — E1169 Type 2 diabetes mellitus with other specified complication: Secondary | ICD-10-CM

## 2022-09-06 NOTE — Telephone Encounter (Signed)
Patient lvm requesting referral to Endocrinology - Francetta Found @ [address]

## 2022-09-13 ENCOUNTER — Ambulatory Visit (INDEPENDENT_AMBULATORY_CARE_PROVIDER_SITE_OTHER): Payer: MEDICARE | Admitting: Podiatrist

## 2022-09-13 ENCOUNTER — Encounter: Payer: Self-pay | Admitting: Podiatrist

## 2022-09-13 DIAGNOSIS — Z794 Long term (current) use of insulin: Secondary | ICD-10-CM

## 2022-09-13 DIAGNOSIS — B351 Tinea unguium: Secondary | ICD-10-CM | POA: Diagnosis not present

## 2022-09-13 DIAGNOSIS — E1142 Type 2 diabetes mellitus with diabetic polyneuropathy: Secondary | ICD-10-CM

## 2022-09-13 DIAGNOSIS — M79675 Pain in left toe(s): Secondary | ICD-10-CM

## 2022-09-13 DIAGNOSIS — M79674 Pain in right toe(s): Secondary | ICD-10-CM

## 2022-09-13 NOTE — Progress Notes (Signed)
  Subjective:  Patient ID: Adriana Gibson, female    DOB: 10-11-1951,   MRN: [id number]  Chief Complaint  Patient presents with   Nail Problem     Routine foot care    71 y.o. female presents for concern of thickened elongated and painful nails that are difficult to trim. Requesting to have them trimmed today. Relates burning and tingling in their feet. Patient is diabetic  .   PCP:  Luetta Nutting, DO    . Denies any other pedal complaints. Denies n/v/f/c.   Past Medical History:  Diagnosis Date   Diabetes mellitus ([place]) 06/14/2020   Essential hypertension 06/14/2020   Gastroesophageal reflux disease 06/14/2020   History of hysterectomy for benign disease 06/14/2020   Hyperlipidemia associated with type 2 diabetes mellitus ([place]) 06/14/2020   Hypothyroidism 06/14/2020    Objective:  Physical Exam: Vascular: DP/PT pulses 2/4 bilateral. CFT <3 seconds. Absent hair growth on digits. Edema noted to bilateral lower extremities. Xerosis noted bilaterally.  Skin. No lacerations or abrasions bilateral feet. Nails 1-5 bilateral  are thickened discolored and elongated with subungual debris.  Hyperkeratotic spiked lesion on anterior ankle with abnormal appearance, following with dermatology for removal.  Musculoskeletal: MMT 5/5 bilateral lower extremities in DF, PF, Inversion and Eversion. Deceased ROM in DF of ankle joint.  Neurological: Sensation intact to light touch. Protective sensation diminished bilateral.    Assessment:     ICD-10-CM   1. Pain due to onychomycosis of toenails of both feet  B35.1    M79.675    M79.674     2. Type 2 diabetes mellitus with diabetic polyneuropathy, with long-term current use of insulin (HCC)  E11.42    Z79.4          Plan:  Patient was evaluated and treated and all questions answered. -Discussed supportive shoes at all times and checking feet regularly.  -Mechanically debrided all nails 1-5 bilateral using sterile nail nipper and filed with  dremel without incident  -Answered all patient questions -Patient to return  in 3 months for at risk foot care -Patient advised to call the office if any problems or questions arise in the meantime.

## 2022-09-26 ENCOUNTER — Other Ambulatory Visit: Payer: Self-pay | Admitting: Family Medicine

## 2022-10-10 DIAGNOSIS — E1121 Type 2 diabetes mellitus with diabetic nephropathy: Secondary | ICD-10-CM | POA: Insufficient documentation

## 2022-10-10 DIAGNOSIS — E119 Type 2 diabetes mellitus without complications: Secondary | ICD-10-CM | POA: Insufficient documentation

## 2022-10-10 LAB — HEMOGLOBIN A1C: Hemoglobin A1C: 6.3

## 2022-10-11 LAB — PROTEIN / CREATININE RATIO, URINE
Albumin, U: 13.4
Creatinine, Urine: 117.7

## 2022-10-11 LAB — MICROALBUMIN / CREATININE URINE RATIO: Microalb Creat Ratio: 11.4

## 2022-11-29 LAB — HM DIABETES EYE EXAM

## 2022-12-05 ENCOUNTER — Encounter: Payer: Self-pay | Admitting: Family Medicine

## 2022-12-05 ENCOUNTER — Ambulatory Visit (INDEPENDENT_AMBULATORY_CARE_PROVIDER_SITE_OTHER): Payer: MEDICARE | Admitting: Family Medicine

## 2022-12-05 VITALS — BP 113/73 | HR 78 | Ht 60.0 in | Wt 199.0 lb

## 2022-12-05 DIAGNOSIS — E119 Type 2 diabetes mellitus without complications: Secondary | ICD-10-CM

## 2022-12-05 DIAGNOSIS — E039 Hypothyroidism, unspecified: Secondary | ICD-10-CM | POA: Diagnosis not present

## 2022-12-05 DIAGNOSIS — I10 Essential (primary) hypertension: Secondary | ICD-10-CM | POA: Diagnosis not present

## 2022-12-05 DIAGNOSIS — E785 Hyperlipidemia, unspecified: Secondary | ICD-10-CM

## 2022-12-05 DIAGNOSIS — Z794 Long term (current) use of insulin: Secondary | ICD-10-CM

## 2022-12-05 DIAGNOSIS — Z8673 Personal history of transient ischemic attack (TIA), and cerebral infarction without residual deficits: Secondary | ICD-10-CM

## 2022-12-05 DIAGNOSIS — E1169 Type 2 diabetes mellitus with other specified complication: Secondary | ICD-10-CM

## 2022-12-05 NOTE — Assessment & Plan Note (Signed)
BP is well controlled with current medications.  Recommend continuation of current medications.

## 2022-12-05 NOTE — Assessment & Plan Note (Signed)
Continue management of risk factors.  Continue asa and atorvastatin.

## 2022-12-05 NOTE — Assessment & Plan Note (Signed)
Levothyroxine recently adjusted.  Update TSH today.

## 2022-12-05 NOTE — Assessment & Plan Note (Signed)
Continue atorvastatin

## 2022-12-05 NOTE — Assessment & Plan Note (Signed)
Diabetes is well controlled.  Encouraged her to discuss her hypoglycemic episodes with her endocrinologist.  May need to titrate Ozempic further to help with weight loss and decrease insulin.

## 2022-12-05 NOTE — Progress Notes (Signed)
Adriana Gibson - 72 y.o. female MRN [id number]  Date of birth: 06/13/1951  Subjective Chief Complaint  Patient presents with   Follow-up    HPI Adriana Gibson is a 72 y.o. female here today for follow up visit.   Continues to see endocrinology for management of diabetes.  A1c in December was 6.3%.  She does report having some episodes of hypoglycemia.  Reading was 69 this morning.  She is on Ozempic 0.'5mg'$  weekly.  She is no longer seeing weight loss effects from this.  She is using toujeo and humalog in addition to this.   BP remains well controlled with combination of lisinopril, metoprolol, hydralazine and aldactone.  Tolerating medications well at current strength.  Denies symptoms of hypotension.    Levothyroxine adjusted at previous visit.  She has not had repeat thyroid function checked.    Remains on daily asa and statin due to history of CVA.    ROS:  A comprehensive ROS was completed and negative except as noted per HPI  No Known Allergies  Past Medical History:  Diagnosis Date   Diabetes mellitus ([place]) 06/14/2020   Essential hypertension 06/14/2020   Gastroesophageal reflux disease 06/14/2020   History of hysterectomy for benign disease 06/14/2020   Hyperlipidemia associated with type 2 diabetes mellitus ([place]) 06/14/2020   Hypothyroidism 06/14/2020    Past Surgical History:  Procedure Laterality Date   ABDOMINAL HYSTERECTOMY     CESAREAN SECTION      Social History   Socioeconomic History   Marital status: Married    Spouse name: Yarieliz Wasser Sr.   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 Enjoys watching t.v.   Social Determinants of Health   Financial Resource Strain: Low Risk  (12/03/2021)   Overall Financial Resource Strain (CARDIA)    Difficulty of Paying Living Expenses: Not hard at all  Food Insecurity: No Food Insecurity (12/03/2021)   Hunger Vital Sign    Worried About Running Out of Food in the Last Year: Never true    [place] in the Last Year: Never true  Transportation Needs: No Transportation Needs (12/03/2021)   PRAPARE - Hydrologist (Medical): No    Lack of Transportation (Non-Medical): No  Physical Activity: Inactive (12/03/2021)   Exercise Vital Sign    Days of Exercise per Week: 0 days    Minutes of Exercise per Session: 0 min  Stress: No Stress Concern Present (12/03/2021)   [place]    Feeling of Stress : Not at all  Social Connections: Moderately Isolated (12/03/2021)   Social Connection and Isolation Panel [NHANES]    Frequency of Communication with Friends and Family: More than three times a week    Frequency of Social Gatherings with Friends and Family: Never    Attends Religious Services: Never    Marine scientist or Organizations: No  Attends Archivist Meetings: Never    Marital Status: Married    Family History  Problem Relation Age of Onset   Cancer Mother        throat   Cancer Father    Heart disease Brother     Health Maintenance  Topic Date Due   Pneumonia Vaccine 42+ Years old (1 of 1 - PCV) Never done   Diabetic kidney evaluation - eGFR measurement  06/14/2021   MAMMOGRAM  06/29/2022   OPHTHALMOLOGY EXAM  10/04/2022   Medicare Annual Wellness (AWV)  12/03/2022   COVID-19 Vaccine (5 - 2023-24 season) 03/21/2023 (Originally 06/28/2022)   DEXA SCAN  06/05/2023 (Originally 12/30/2013)   COLONOSCOPY (Pts 45-22yr Insurance coverage will  need to be confirmed)  06/05/2023 (Originally 12/30/1993)   Hepatitis C Screening  06/05/2023 (Originally 12/31/1966)   Zoster Vaccines- Shingrix (2 of 2) 01/02/2023   HEMOGLOBIN A1C  04/11/2023   FOOT EXAM  06/05/2023   Diabetic kidney evaluation - Urine ACR  10/12/2023   INFLUENZA VACCINE  Completed   HPV VACCINES  Aged Out   DTaP/Tdap/Td  Discontinued     ----------------------------------------------------------------------------------------------------------------------------------------------------------------------------------------------------------------- Physical Exam BP 113/73 (BP Location: Left Arm, Patient Position: Sitting, Cuff Size: Large)   Pulse 78   Ht 5' (1.524 m)   Wt 199 lb 0.6 oz (90.3 kg)   SpO2 100%   BMI 38.87 kg/m   Physical Exam Constitutional:      Appearance: Normal appearance.  Eyes:     General: No scleral icterus. Cardiovascular:     Rate and Rhythm: Normal rate and regular rhythm.  Pulmonary:     Effort: Pulmonary effort is normal.     Breath sounds: Normal breath sounds.  Neurological:     Mental Status: She is alert.  Psychiatric:        Mood and Affect: Mood normal.        Behavior: Behavior normal.     ------------------------------------------------------------------------------------------------------------------------------------------------------------------------------------------------------------------- Assessment and Plan  Essential hypertension BP is well controlled with current medications.  Recommend continuation of current medications.    Hypothyroidism Levothyroxine recently adjusted.  Update TSH today.   Type 2 diabetes mellitus without complication, with long-term current use of insulin ([place] Diabetes is well controlled.  Encouraged her to discuss her hypoglycemic episodes with her endocrinologist.  May need to titrate Ozempic further to help with weight loss and decrease insulin.    Hyperlipidemia associated with  type 2 diabetes mellitus ([place] Continue atorvastatin  History of ischemic stroke Continue management of risk factors.  Continue asa and atorvastatin.    No orders of the defined types were placed in this encounter.   Return in about 6 months (around 06/05/2023) for HTN.    This visit occurred during the SARS-CoV-2 public health emergency.  Safety protocols were in place, including screening questions prior to the visit, additional usage of staff PPE, and extensive cleaning of exam room while observing appropriate contact time as indicated for disinfecting solutions.

## 2022-12-06 ENCOUNTER — Other Ambulatory Visit: Payer: Self-pay | Admitting: Family Medicine

## 2022-12-06 DIAGNOSIS — E039 Hypothyroidism, unspecified: Secondary | ICD-10-CM

## 2022-12-06 LAB — TSH: TSH: 0.35 mIU/L — ABNORMAL LOW (ref 0.40–4.50)

## 2022-12-06 MED ORDER — LEVOTHYROXINE SODIUM 112 MCG PO TABS: 112.0000 ug | ORAL_TABLET | Freq: Every day | ORAL | 1 refills | Status: DC

## 2022-12-09 ENCOUNTER — Ambulatory Visit (INDEPENDENT_AMBULATORY_CARE_PROVIDER_SITE_OTHER): Payer: MEDICARE | Admitting: Family Medicine

## 2022-12-09 DIAGNOSIS — Z780 Asymptomatic menopausal state: Secondary | ICD-10-CM | POA: Diagnosis not present

## 2022-12-09 DIAGNOSIS — Z0000 Encounter for general adult medical examination without abnormal findings: Secondary | ICD-10-CM

## 2022-12-09 DIAGNOSIS — Z1231 Encounter for screening mammogram for malignant neoplasm of breast: Secondary | ICD-10-CM

## 2022-12-09 NOTE — Patient Instructions (Signed)
[place] Maintenance Summary and Written Plan of Care  Adriana Gibson ,  Thank you for allowing me to perform your Medicare Annual Wellness Visit and for your ongoing commitment to your health.   Health Maintenance & Immunization History Health Maintenance  Topic Date Due   Diabetic kidney evaluation - eGFR measurement  02/26/2023 (Originally 06/14/2021)   COVID-19 Vaccine (5 - 2023-24 season) 03/21/2023 (Originally 06/28/2022)   DEXA SCAN  06/05/2023 (Originally 12/30/2013)   COLONOSCOPY (Pts 45-63yr Insurance coverage will need to be confirmed)  06/05/2023 (Originally 12/30/1993)   Hepatitis C Screening  06/05/2023 (Originally 12/31/1966)   MAMMOGRAM  12/06/2023 (Originally 06/29/2022)   Pneumonia Vaccine 74 Years old (1 of 1 - PCV) 01/27/2024 (Originally 12/30/2013)   Zoster Vaccines- Shingrix (2 of 2) 01/02/2023   HEMOGLOBIN A1C  04/11/2023   FOOT EXAM  06/05/2023   Diabetic kidney evaluation - Urine ACR  10/12/2023   OPHTHALMOLOGY EXAM  11/30/2023   Medicare Annual Wellness (AWV)  12/10/2023   INFLUENZA VACCINE  Completed   HPV VACCINES  Aged Out   DTaP/Tdap/Td  Discontinued   Immunization History  Administered Date(s) Administered   Influenza-Unspecified 08/22/2020, 10/11/2021, 11/07/2022   Moderna Covid-19 Vaccine Bivalent Booster 133yr& up 10/11/2021   Moderna Sars-Covid-2 Vaccination 10/11/2021   PFIZER(Purple Top)SARS-COV-2 Vaccination 12/27/2019, 01/26/2020, 08/22/2020   Respiratory Syncytial Virus Vaccine,Recomb Aduvanted(Arexvy) 11/07/2022   Zoster Recombinat (Shingrix) 11/07/2022    These are the patient goals that we discussed:  Goals Addressed               This Visit's Progress     Patient Stated (pt-stated)        Patient would like to continue to loose weight about 40 lbs.         This is a list of Health Maintenance Items that are overdue or due now: Pneumococcal vaccine  Screening mammography Bone densitometry  screening Colorectal cancer screening 2nd dose of shingles vaccine  Orders/Referrals Placed Today: Orders Placed This Encounter  Procedures   DEXAScan    Standing Status:   Future    Standing Expiration Date:   12/10/2023    Scheduling Instructions:     Please call patient to schedule.    Order Specific Question:   Reason for exam:    Answer:   post menopausal    Order Specific Question:   Preferred imaging location?    Answer:   [facility] S[facility] BRE[facility]ILATER[facility]    Standing Status:   Future    Standing Expiration Date:   12/10/2023    Scheduling Instructions:     Please call patient to schedule.    Order Specific Question:   Reason for Exam (SYMPTOM  OR DIAGNOSIS REQUIRED)    Answer:   Breast cancer screening    Order Specific Question:   Preferred imaging location?    Answer:   [facility]  (Contact our referral department at [phone] you have not spoken with someone about your referral appointment within the next 5 days)    Follow-up Plan Follow-up with MaLuetta NuttingDO as planned Schedule 2nd dose of shingles vaccine after 01/02/23. Medicare wellness visit in one year. Patient will access AVS on my chart.      Health Maintenance, Female Adopting a healthy lifestyle and getting preventive care are important in promoting health and wellness. Ask your health care provider about: The right schedule for you to have regular tests and  exams. Things you can do on your own to prevent diseases and keep yourself healthy. What should I know about diet, weight, and exercise? Eat a healthy diet  Eat a diet that includes plenty of vegetables, fruits, low-fat dairy products, and lean protein. Do not eat a lot of foods that are high in solid fats, added sugars, or sodium. Maintain a healthy weight Body mass index (BMI) is used to identify weight problems. It estimates body fat based on height and weight. Your health care provider can  help determine your BMI and help you achieve or maintain a healthy weight. Get regular exercise Get regular exercise. This is one of the most important things you can do for your health. Most adults should: Exercise for at least 150 minutes each week. The exercise should increase your heart rate and make you sweat (moderate-intensity exercise). Do strengthening exercises at least twice a week. This is in addition to the moderate-intensity exercise. Spend less time sitting. Even light physical activity can be beneficial. Watch cholesterol and blood lipids Have your blood tested for lipids and cholesterol at 74 years of age, then have this test every 5 years. Have your cholesterol levels checked more often if: Your lipid or cholesterol levels are high. You are older than 74 years of age. You are at high risk for heart disease. What should I know about cancer screening? Depending on your health history and family history, you may need to have cancer screening at various ages. This may include screening for: Breast cancer. Cervical cancer. Colorectal cancer. Skin cancer. Lung cancer. What should I know about heart disease, diabetes, and high blood pressure? Blood pressure and heart disease High blood pressure causes heart disease and increases the risk of stroke. This is more likely to develop in people who have high blood pressure readings or are overweight. Have your blood pressure checked: Every 3-5 years if you are 76-52 years of age. Every year if you are 57 years old or older. Diabetes Have regular diabetes screenings. This checks your fasting blood sugar level. Have the screening done: Once every three years after age 78 if you are at a normal weight and have a low risk for diabetes. More often and at a younger age if you are overweight or have a high risk for diabetes. What should I know about preventing infection? Hepatitis B If you have a higher risk for hepatitis B, you should  be screened for this virus. Talk with your health care provider to find out if you are at risk for hepatitis B infection. Hepatitis C Testing is recommended for: Everyone born from [birth year] through 1967. Anyone with known risk factors for hepatitis C. Sexually transmitted infections (STIs) Get screened for STIs, including gonorrhea and chlamydia, if: You are sexually active and are younger than 74 years of age. You are older than 74 years of age and your health care provider tells you that you are at risk for this type of infection. Your sexual activity has changed since you were last screened, and you are at increased risk for chlamydia or gonorrhea. Ask your health care provider if you are at risk. Ask your health care provider about whether you are at high risk for HIV. Your health care provider may recommend a prescription medicine to help prevent HIV infection. If you choose to take medicine to prevent HIV, you should first get tested for HIV. You should then be tested every 3 months for as long as you are taking the  medicine. Pregnancy If you are about to stop having your period (premenopausal) and you may become pregnant, seek counseling before you get pregnant. Take 400 to 800 micrograms (mcg) of folic acid every day if you become pregnant. Ask for birth control (contraception) if you want to prevent pregnancy. Osteoporosis and menopause Osteoporosis is a disease in which the bones lose minerals and strength with aging. This can result in bone fractures. If you are 48 years old or older, or if you are at risk for osteoporosis and fractures, ask your health care provider if you should: Be screened for bone loss. Take a calcium or vitamin D supplement to lower your risk of fractures. Be given hormone replacement therapy (HRT) to treat symptoms of menopause. Follow these instructions at home: Alcohol use Do not drink alcohol if: Your health care provider tells you not to drink. You are  pregnant, may be pregnant, or are planning to become pregnant. If you drink alcohol: Limit how much you have to: 0-1 drink a day. Know how much alcohol is in your drink. In the [place] one drink equals one 12 oz bottle of beer (355 mL), one 5 oz glass of wine (148 mL), or one 1 oz glass of hard liquor (44 mL). Lifestyle Do not use any products that contain nicotine or tobacco. These products include cigarettes, chewing tobacco, and vaping devices, such as e-cigarettes. If you need help quitting, ask your health care provider. Do not use street drugs. Do not share needles. Ask your health care provider for help if you need support or information about quitting drugs. General instructions Schedule regular health, dental, and eye exams. Stay current with your vaccines. Tell your health care provider if: You often feel depressed. You have ever been abused or do not feel safe at home. Summary Adopting a healthy lifestyle and getting preventive care are important in promoting health and wellness. Follow your health care provider's instructions about healthy diet, exercising, and getting tested or screened for diseases. Follow your health care provider's instructions on monitoring your cholesterol and blood pressure. This information is not intended to replace advice given to you by your health care provider. Make sure you discuss any questions you have with your health care provider. Document Revised: 03/05/2021 Document Reviewed: 03/05/2021 Elsevier Patient Education  [place].

## 2022-12-09 NOTE — Progress Notes (Signed)
MEDICARE ANNUAL WELLNESS VISIT  12/09/2022  Telephone Visit Disclaimer This Medicare AWV was conducted by telephone due to national recommendations for restrictions regarding the COVID-19 Pandemic (e.g. social distancing).  I verified, using two identifiers, that I am speaking with [name] or their authorized healthcare agent. I discussed the limitations, risks, security, and privacy concerns of performing an evaluation and management service by telephone and the potential availability of an in-person appointment in the future. The patient expressed understanding and agreed to proceed.  Location of Patient: Home Location of Provider (nurse):  In the office  Subjective:    Sharquita Kuck is a 72 y.o. female patient of Luetta Nutting, DO who had a Medicare Annual Wellness Visit today via telephone. Aldean is Retired and lives with their spouse. she has 4 children. she reports that she is socially active and does interact with friends/family regularly. she is minimally physically active and enjoys watching television.  Patient Care Team: Luetta Nutting, DO as PCP - General (Family Medicine)     12/09/2022    1:58 PM 12/03/2021    1:45 PM 10/30/2020    2:15 PM  Advanced Directives  Does Patient Have a Medical Advance Directive? No No No  Would patient like information on creating a medical advance directive? No - Patient declined No - Patient declined No - Patient declined    Hospital Utilization Over the Past 12 Months: # of hospitalizations or ER visits: 0 # of surgeries: 0  Review of Systems    Patient reports that her overall health is better compared to last year.  History obtained from chart review and the patient  Patient Reported Readings (BP, Pulse, CBG, Weight, etc) none  Pain Assessment Pain : No/denies pain     Current Medications & Allergies (verified) Allergies as of 12/09/2022   No Known Allergies      Medication List        Accurate as of  December 09, 2022  2:12 PM. If you have any questions, ask your nurse or doctor.          aspirin EC 81 MG tablet Take 81 mg by mouth daily. Swallow whole.   atorvastatin 80 MG tablet Commonly known as: LIPITOR Take 1 tablet (80 mg total) by mouth daily.   BD Pen Needle Nano 2nd Gen 32G X 4 MM Misc Generic drug: Insulin Pen Needle As directed w/ insulin   clopidogrel 75 MG tablet Commonly known as: PLAVIX Take 1 tablet (75 mg total) by mouth daily.   famotidine 20 MG tablet Commonly known as: PEPCID TAKE 1 TABLET BY MOUTH EVERY DAY   HumaLOG KwikPen 200 UNIT/ML KwikPen Generic drug: insulin lispro INJECT 20-15-20-0 PLUS SCALE MDD:70  per endocrinology What changed: additional instructions   hydrALAZINE 50 MG tablet Commonly known as: APRESOLINE TAKE 1.5 TABLETS (75 MG TOTAL) BY MOUTH 2 (TWO) TIMES DAILY. TWO TIMES DAILY   levothyroxine 112 MCG tablet Commonly known as: SYNTHROID Take 1 tablet (112 mcg total) by mouth daily.   lisinopril 40 MG tablet Commonly known as: ZESTRIL TAKE 1 TABLET BY MOUTH EVERY DAY   metoprolol tartrate 50 MG tablet Commonly known as: LOPRESSOR TAKE 1 TABLET BY MOUTH TWICE A DAY   OneTouch Verio test strip Generic drug: glucose blood 3 (three) times daily.   Ozempic (0.25 or 0.5 MG/DOSE) 2 MG/1.5ML Sopn Generic drug: Semaglutide(0.25 or 0.5MG/DOS) Inject into the skin. 0.5 mg weekly   pantoprazole 40 MG tablet Commonly known as: PROTONIX TAKE  1 TABLET BY MOUTH EVERY DAY   pioglitazone 15 MG tablet Commonly known as: ACTOS Take 1 tablet (15 mg total) by mouth daily.   spironolactone 50 MG tablet Commonly known as: ALDACTONE TAKE 1 TABLET BY MOUTH EVERY DAY   Toujeo Max SoloStar 300 UNIT/ML Solostar Pen Generic drug: insulin glargine (2 Unit Dial) Inject 30-100 Units into the skin daily. As directed by endocrinology What changed: additional instructions   Vitamin D3 50 MCG (2000 UT) capsule Take by mouth.         History (reviewed): Past Medical History:  Diagnosis Date   Diabetes mellitus ([place]) 06/14/2020   Essential hypertension 06/14/2020   Gastroesophageal reflux disease 06/14/2020   History of hysterectomy for benign disease 06/14/2020   Hyperlipidemia associated with type 2 diabetes mellitus ([place]) 06/14/2020   Hypothyroidism 06/14/2020   Past Surgical History:  Procedure Laterality Date   ABDOMINAL HYSTERECTOMY     CESAREAN SECTION     Family History  Problem Relation Age of Onset   Cancer Mother        throat   Cancer Father    Heart disease Brother    Social History   Socioeconomic History   Marital status: Married    Spouse name: Rosaline Torcivia Sr.   Number of children: 4   Years of education: 12   Highest education level: 12th grade  Occupational History   Occupation: retired    Comment: worked at a Therapist, art  Tobacco Use   Smoking status: Never   Smokeless tobacco: Never  Scientific laboratory technician Use: Never used  Substance and Sexual Activity   Alcohol use: Never   Drug use: Never   Sexual activity: Not Currently    Partners: Male  Other Topics Concern   Not on file  Social History Narrative   Lives with her husband. Enjoys watching t.v.   Social Determinants of Health   Financial Resource Strain: Low Risk  (12/09/2022)   Overall Financial Resource Strain (CARDIA)    Difficulty of Paying Living Expenses: Not hard at all  Food Insecurity: No Food Insecurity (12/09/2022)   Hunger Vital Sign    Worried About Running Out of Food in the Last Year: Never true    Ran Out of Food in the Last Year: Never true  Transportation Needs: No Transportation Needs (12/09/2022)   PRAPARE - Hydrologist (Medical): No    Lack of Transportation (Non-Medical): No  Physical Activity: Inactive (12/09/2022)   Exercise Vital Sign    Days of Exercise per Week: 0 days    Minutes of Exercise per Session: 0 min  Stress: No Stress Concern  Present (12/09/2022)   [place]    Feeling of Stress : Not at all  Social Connections: Moderately Integrated (12/09/2022)   Social Connection and Isolation Panel [NHANES]    Frequency of Communication with Friends and Family: Twice a week    Frequency of Social Gatherings with Friends and Family: Once a week    Attends Religious Services: 1 to 4 times per year    Active Member of [employer] or Organizations: No    Attends Archivist Meetings: Never    Marital Status: Married    Activities of Daily Living    12/09/2022    2:01 PM  In your present state of health, do you have any difficulty performing the following activities:  Hearing? 0  Vision?  0  Difficulty concentrating or making decisions? 0  Walking or climbing stairs? 0  Dressing or bathing? 0  Doing errands, shopping? 0  Preparing Food and eating ? N  Using the Toilet? N  In the past six months, have you accidently leaked urine? N  Do you have problems with loss of bowel control? N  Managing your Medications? N  Managing your Finances? N  Housekeeping or managing your Housekeeping? N    Patient Education/ Literacy How often do you need to have someone help you when you read instructions, pamphlets, or other written materials from your doctor or pharmacy?: 1 - Never What is the last grade level you completed in school?: 12th grade  Exercise Current Exercise Habits: The patient does not participate in regular exercise at present, Exercise limited by: None identified  Diet Patient reports consuming 2 meals a day and 1 snack(s) a day Patient reports that her primary diet is: Regular Patient reports that she does have regular access to food.   Depression Screen    12/09/2022    1:58 PM 12/05/2022   10:13 AM 06/04/2022   11:12 AM 12/03/2021    1:45 PM 10/30/2020    2:22 PM 06/14/2020   12:00 PM  PHQ 2/9 Scores  PHQ - 2 Score 0 0 3 1 2 1  $ PHQ- 9 Score    14  4 9     $ Fall Risk    12/09/2022    1:58 PM 12/05/2022   10:13 AM 06/04/2022   10:58 AM 12/03/2021    1:45 PM 10/30/2020    2:26 PM  Fall Risk   Falls in the past year? 0 0 0 1 0  Number falls in past yr: 0 0 0 0 0  Injury with Fall? 0 0 0 0 0  Risk for fall due to : No Fall Risks No Fall Risks No Fall Risks History of fall(s) No Fall Risks  Follow up Falls evaluation completed Falls evaluation completed Falls evaluation completed Education provided;Falls prevention discussed;Falls evaluation completed Falls evaluation completed     Objective:  Macaylah Bater seemed alert and oriented and she participated appropriately during our telephone visit.  Blood Pressure Weight BMI  BP Readings from Last 3 Encounters:  12/05/22 113/73  06/04/22 112/68  06/01/21 117/69   Wt Readings from Last 3 Encounters:  12/05/22 199 lb 0.6 oz (90.3 kg)  06/04/22 207 lb (93.9 kg)  06/01/21 235 lb (106.6 kg)   BMI Readings from Last 1 Encounters:  12/05/22 38.87 kg/m    *Unable to obtain current vital signs, weight, and BMI due to telephone visit type  Hearing/Vision  Jayleene did not seem to have difficulty with hearing/understanding during the telephone conversation Reports that she has had a formal eye exam by an eye care professional within the past year Reports that she has not had a formal hearing evaluation within the past year *Unable to fully assess hearing and vision during telephone visit type  Cognitive Function:    12/09/2022    2:03 PM 12/03/2021    2:00 PM 10/30/2020    2:18 PM  6CIT Screen  What Year? 0 points 0 points 0 points  What month? 0 points 0 points 0 points  What time? 0 points 0 points 0 points  Count back from 20 0 points 0 points 0 points  Months in reverse 0 points 0 points 0 points  Repeat phrase 0 points 0 points 2 points  Total Score 0 points  0 points 2 points   (Normal:0-7, Significant for Dysfunction: >8)  Normal Cognitive Function Screening:  Yes   Immunization & Health Maintenance Record Immunization History  Administered Date(s) Administered   Influenza-Unspecified 08/22/2020, 10/11/2021, 11/07/2022   Moderna Covid-19 Vaccine Bivalent Booster 33yr & up 10/11/2021   Moderna Sars-Covid-2 Vaccination 10/11/2021   PFIZER(Purple Top)SARS-COV-2 Vaccination 12/27/2019, 01/26/2020, 08/22/2020   Respiratory Syncytial Virus Vaccine,Recomb Aduvanted(Arexvy) 11/07/2022   Zoster Recombinat (Shingrix) 11/07/2022    Health Maintenance  Topic Date Due   Diabetic kidney evaluation - eGFR measurement  02/26/2023 (Originally 06/14/2021)   COVID-19 Vaccine (5 - 2023-24 season) 03/21/2023 (Originally 06/28/2022)   DEXA SCAN  06/05/2023 (Originally 12/30/2013)   COLONOSCOPY (Pts 45-479yrInsurance coverage will need to be confirmed)  06/05/2023 (Originally 12/30/1993)   Hepatitis C Screening  06/05/2023 (Originally 12/31/1966)   MAMMOGRAM  12/06/2023 (Originally 06/29/2022)   Pneumonia Vaccine 6568Years old (1 of 1 - PCV) 01/27/2024 (Originally 12/30/2013)   Zoster Vaccines- Shingrix (2 of 2) 01/02/2023   HEMOGLOBIN A1C  04/11/2023   FOOT EXAM  06/05/2023   Diabetic kidney evaluation - Urine ACR  10/12/2023   OPHTHALMOLOGY EXAM  11/30/2023   Medicare Annual Wellness (AWV)  12/10/2023   INFLUENZA VACCINE  Completed   HPV VACCINES  Aged Out   DTaP/Tdap/Td  Discontinued       Assessment  This is a routine wellness examination for [employer] Health Maintenance: Due or Overdue There are no preventive care reminders to display for this patient.   [facility] does not need a referral for [employer] Compan[facility]istance: Care Management:   no Social Work:    no Prescription Assistance:  no Nutrition/Diabetes Education:  no   Plan:  Personalized Goals  Goals Addressed               This Visit's Progress     Patient Stated (pt-stated)        Patient would like to continue to loose weight about 40 lbs.       Personalized Health  Maintenance & Screening Recommendations  Pneumococcal vaccine  Screening mammography Bone densitometry screening Colorectal cancer screening 2nd dose of shingles vaccine  Lung Cancer Screening Recommended: no (Low Dose CT Chest recommended if Age 74-80ears, 30 pack-year currently smoking OR have quit w/in past 15 years) Hepatitis C Screening recommended: no HIV Screening recommended: no  Advanced Directives: Written information was not prepared per patient's request.  Referrals & Orders Orders Placed This Encounter  Procedures   DEXAScan   Mammogram 3D SCREEN BREAST BILATERAL    Follow-up Plan Follow-up with MaLuetta NuttingDO as planned Schedule 2nd dose of shingles vaccine after 01/02/23. Medicare wellness visit in one year. Patient will access AVS on my chart.   I have personally reviewed and noted the following in the patient's chart:   Medical and social history Use of alcohol, tobacco or illicit drugs  Current medications and supplements Functional ability and status Nutritional status Physical activity Advanced directives List of other physicians Hospitalizations, surgeries, and ER visits in previous 12 months Vitals Screenings to include cognitive, depression, and falls Referrals and appointments  In addition, I have reviewed and discussed with [facility] preventive protocols, quality metrics, and best practice recommendations. A written personalized care plan for preventive services as well as general preventive health recommendations is available and can be mailed to the patient at her request.      BaTinnie GensRN BSN  12/09/2022

## 2022-12-13 ENCOUNTER — Encounter: Payer: Self-pay | Admitting: Podiatry

## 2022-12-13 ENCOUNTER — Ambulatory Visit (INDEPENDENT_AMBULATORY_CARE_PROVIDER_SITE_OTHER): Payer: MEDICARE | Admitting: Podiatry

## 2022-12-13 ENCOUNTER — Other Ambulatory Visit: Payer: Self-pay | Admitting: Family Medicine

## 2022-12-13 DIAGNOSIS — Z794 Long term (current) use of insulin: Secondary | ICD-10-CM | POA: Diagnosis not present

## 2022-12-13 DIAGNOSIS — M79675 Pain in left toe(s): Secondary | ICD-10-CM | POA: Diagnosis not present

## 2022-12-13 DIAGNOSIS — E1142 Type 2 diabetes mellitus with diabetic polyneuropathy: Secondary | ICD-10-CM

## 2022-12-13 DIAGNOSIS — M79674 Pain in right toe(s): Secondary | ICD-10-CM

## 2022-12-13 DIAGNOSIS — B351 Tinea unguium: Secondary | ICD-10-CM

## 2022-12-13 NOTE — Progress Notes (Signed)
  Subjective:  Patient ID: Adriana Gibson, female    DOB: 05-06-1951,   MRN: [id number]  Chief Complaint  Patient presents with   Nail Problem     Routine foot care    72 y.o. female presents for concern of thickened elongated and painful nails that are difficult to trim. Requesting to have them trimmed today. Relates burning and tingling in their feet. Patient is diabetic and last A1c was  Lab Results  Component Value Date   HGBA1C 6.3 10/10/2022   .   PCP:  Luetta Nutting, DO    . Denies any other pedal complaints. Denies n/v/f/c.   Past Medical History:  Diagnosis Date   Diabetes mellitus ([place]) 06/14/2020   Essential hypertension 06/14/2020   Gastroesophageal reflux disease 06/14/2020   History of hysterectomy for benign disease 06/14/2020   Hyperlipidemia associated with type 2 diabetes mellitus ([place]) 06/14/2020   Hypothyroidism 06/14/2020    Objective:  Physical Exam: Vascular: DP/PT pulses 2/4 bilateral. CFT <3 seconds. Absent hair growth on digits. Edema noted to bilateral lower extremities. Xerosis noted bilaterally.  Skin. No lacerations or abrasions bilateral feet. Nails 1-5 bilateral  are thickened discolored and elongated with subungual debris.  Hyperkeratotic spiked lesion on anterior ankle with abnormal appearance, following with dermatology for removal.  Musculoskeletal: MMT 5/5 bilateral lower extremities in DF, PF, Inversion and Eversion. Deceased ROM in DF of ankle joint.  Neurological: Sensation intact to light touch. Protective sensation diminished bilateral.    Assessment:   1. Pain due to onychomycosis of toenails of both feet   2. Type 2 diabetes mellitus with diabetic polyneuropathy, with long-term current use of insulin ([place])       Plan:  Patient was evaluated and treated and all questions answered. -Discussed and educated patient on diabetic foot care, especially with  regards to the vascular, neurological and musculoskeletal systems.  -Stressed the  importance of good glycemic control and the detriment of not  controlling glucose levels in relation to the foot. -Discussed supportive shoes at all times and checking feet regularly.  -Mechanically debrided all nails 1-5 bilateral using sterile nail nipper and filed with dremel without incident  -Answered all patient questions -Patient to return  in 3 months for at risk foot care -Patient advised to call the office if any problems or questions arise in the meantime.   Lorenda Peck, DPM

## 2022-12-18 ENCOUNTER — Other Ambulatory Visit: Payer: Self-pay | Admitting: Family Medicine

## 2022-12-19 ENCOUNTER — Ambulatory Visit (INDEPENDENT_AMBULATORY_CARE_PROVIDER_SITE_OTHER): Payer: MEDICARE

## 2022-12-19 DIAGNOSIS — Z1231 Encounter for screening mammogram for malignant neoplasm of breast: Secondary | ICD-10-CM

## 2022-12-19 DIAGNOSIS — Z0000 Encounter for general adult medical examination without abnormal findings: Secondary | ICD-10-CM

## 2022-12-30 ENCOUNTER — Other Ambulatory Visit: Payer: Self-pay | Admitting: Family Medicine

## 2023-03-28 ENCOUNTER — Other Ambulatory Visit: Payer: Self-pay | Admitting: Family Medicine

## 2023-04-10 ENCOUNTER — Encounter: Payer: Self-pay | Admitting: Podiatry

## 2023-04-10 ENCOUNTER — Ambulatory Visit (INDEPENDENT_AMBULATORY_CARE_PROVIDER_SITE_OTHER): Payer: MEDICARE | Admitting: Podiatry

## 2023-04-10 DIAGNOSIS — M79674 Pain in right toe(s): Secondary | ICD-10-CM

## 2023-04-10 DIAGNOSIS — Z794 Long term (current) use of insulin: Secondary | ICD-10-CM

## 2023-04-10 DIAGNOSIS — M79675 Pain in left toe(s): Secondary | ICD-10-CM

## 2023-04-10 DIAGNOSIS — B351 Tinea unguium: Secondary | ICD-10-CM | POA: Diagnosis not present

## 2023-04-10 DIAGNOSIS — E1142 Type 2 diabetes mellitus with diabetic polyneuropathy: Secondary | ICD-10-CM | POA: Diagnosis not present

## 2023-04-10 NOTE — Progress Notes (Signed)
  Subjective:  Patient ID: Adriana Gibson, female    DOB: [birth year],   MRN: [id number]  Chief Complaint  Patient presents with   Nail Problem     Routine foot care     72 y.o. female presents for concern of thickened elongated and painful nails that are difficult to trim. Requesting to have them trimmed today. Relates burning and tingling in their feet. Patient is diabetic and last A1c was  Lab Results  Component Value Date   HGBA1C 6.3 10/10/2022   .   PCP:  Everrett Coombe, DO    . Denies any other pedal complaints. Denies n/v/f/c.   Past Medical History:  Diagnosis Date   Diabetes mellitus (HCC) 06/14/2020   Essential hypertension 06/14/2020   Gastroesophageal reflux disease 06/14/2020   History of hysterectomy for benign disease 06/14/2020   Hyperlipidemia associated with type 2 diabetes mellitus (HCC) 06/14/2020   Hypothyroidism 06/14/2020    Objective:  Physical Exam: Vascular: DP/PT pulses 2/4 bilateral. CFT <3 seconds. Absent hair growth on digits. Edema noted to bilateral lower extremities. Xerosis noted bilaterally.  Skin. No lacerations or abrasions bilateral feet. Nails 1-5 bilateral  are thickened discolored and elongated with subungual debris.  Hyperkeratotic spiked lesion on anterior ankle with abnormal appearance, following with dermatology for removal.  Musculoskeletal: MMT 5/5 bilateral lower extremities in DF, PF, Inversion and Eversion. Deceased ROM in DF of ankle joint.  Neurological: Sensation intact to light touch. Protective sensation diminished bilateral.    Assessment:   1. Pain due to onychomycosis of toenails of both feet   2. Type 2 diabetes mellitus with diabetic polyneuropathy, with long-term current use of insulin (HCC)        Plan:  Patient was evaluated and treated and all questions answered. -Discussed and educated patient on diabetic foot care, especially with  regards to the vascular, neurological and musculoskeletal systems.  -Stressed  the importance of good glycemic control and the detriment of not  controlling glucose levels in relation to the foot. -Discussed supportive shoes at all times and checking feet regularly.  -Mechanically debrided all nails 1-5 bilateral using sterile nail nipper and filed with dremel without incident  -Answered all patient questions -Patient to return  in 4 months for at risk foot care -Patient advised to call the office if any problems or questions arise in the meantime.   Louann Sjogren, DPM

## 2023-04-14 LAB — HEMOGLOBIN A1C: Hemoglobin A1C: 5.9

## 2023-06-03 ENCOUNTER — Other Ambulatory Visit: Payer: Self-pay | Admitting: Family Medicine

## 2023-06-03 NOTE — Telephone Encounter (Signed)
Hold for 06/10/23 appt

## 2023-06-05 NOTE — Telephone Encounter (Signed)
Spouse called.  He is following up on refills for Famotidine, spironolactone, levothyroxine(3 or 4 more days left on this med), hydralazine.

## 2023-06-10 ENCOUNTER — Ambulatory Visit (INDEPENDENT_AMBULATORY_CARE_PROVIDER_SITE_OTHER): Payer: MEDICARE | Admitting: Family Medicine

## 2023-06-10 ENCOUNTER — Encounter: Payer: Self-pay | Admitting: Family Medicine

## 2023-06-10 VITALS — BP 109/67 | HR 70 | Ht 60.0 in | Wt 209.0 lb

## 2023-06-10 DIAGNOSIS — E1169 Type 2 diabetes mellitus with other specified complication: Secondary | ICD-10-CM

## 2023-06-10 DIAGNOSIS — E559 Vitamin D deficiency, unspecified: Secondary | ICD-10-CM | POA: Diagnosis not present

## 2023-06-10 DIAGNOSIS — Z794 Long term (current) use of insulin: Secondary | ICD-10-CM

## 2023-06-10 DIAGNOSIS — E119 Type 2 diabetes mellitus without complications: Secondary | ICD-10-CM | POA: Diagnosis not present

## 2023-06-10 DIAGNOSIS — E785 Hyperlipidemia, unspecified: Secondary | ICD-10-CM

## 2023-06-10 DIAGNOSIS — E039 Hypothyroidism, unspecified: Secondary | ICD-10-CM | POA: Diagnosis not present

## 2023-06-10 DIAGNOSIS — Z8673 Personal history of transient ischemic attack (TIA), and cerebral infarction without residual deficits: Secondary | ICD-10-CM

## 2023-06-10 DIAGNOSIS — Z5320 Procedure and treatment not carried out because of patient's decision for unspecified reasons: Secondary | ICD-10-CM

## 2023-06-10 DIAGNOSIS — I10 Essential (primary) hypertension: Secondary | ICD-10-CM

## 2023-06-10 NOTE — Progress Notes (Signed)
Adriana Gibson - 72 y.o. female MRN [id number]  Date of birth: Feb 10, 1951  Subjective Chief Complaint  Patient presents with   Hypertension   Weight Loss    HPI Adriana Gibson is a 72 y.o. female here today for follow up.   She continues to see endocrinology for management of diabetes.  Current treatment with Toujeo, Humalog, actos and Ozempic.  Last A1c was 5.9%.  She is concerned because she is not seeing significant weight loss with ozempic.   Remains on lisinopril, metoprolol, hydralazine, and aldactone. BP is well controlled with current medications.  Denies side effects at current strength.    She has been on stable strength of levothyroxine.    Remains on plavix and asa due to history of stroke and recurrent TIA    ROS:  A comprehensive ROS was completed and negative except as noted per HPI  No Known Allergies  Past Medical History:  Diagnosis Date   Diabetes mellitus (HCC) 06/14/2020   Essential hypertension 06/14/2020   Gastroesophageal reflux disease 06/14/2020   History of hysterectomy for benign disease 06/14/2020   Hyperlipidemia associated with type 2 diabetes mellitus (HCC) 06/14/2020   Hypothyroidism 06/14/2020    Past Surgical History:  Procedure Laterality Date   ABDOMINAL HYSTERECTOMY     CESAREAN SECTION      Social History   Socioeconomic History   Marital status: Married    Spouse name: Olisha Swiatek Sr.   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 Enjoys watching t.v.   Social Determinants of  Health   Financial Resource Strain: Low Risk  (12/09/2022)   Overall Financial Resource Strain (CARDIA)    Difficulty of Paying Living Expenses: Not hard at all  Food Insecurity: No Food Insecurity (12/09/2022)   Hunger Vital Sign    Worried About Running Out of Food in the Last Year: Never true    Ran Out of Food in the Last Year: Never true  Transportation Needs: No Transportation Needs (12/09/2022)   PRAPARE - Administrator, Civil Service (Medical): No    Lack of Transportation (Non-Medical): No  Physical Activity: Inactive (12/09/2022)   Exercise Vital Sign    Days of Exercise per Week: 0 days    Minutes of Exercise per Session: 0 min  Stress: No Stress Concern Present (12/09/2022)   [employer] of Occupational Health - Occupational Stress Questionnaire    Feeling of Stress : Not at all  Social Connections: Moderately Integrated (12/09/2022)   Social Connection and Isolation Panel [NHANES]    Frequency of Communication with Friends and Family: Twice a week    Frequency of Social Gatherings with Friends and Family: Once a week    Attends Religious Services: 1 to 4 times per year    Active Member of [employer] or Organizations: No    Attends Banker Meetings: Never    Marital Status: Married    Family History  Problem Relation  Age of Onset   Cancer Mother        throat   Cancer Father    Heart disease Brother     Health Maintenance  Topic Date Due   Hepatitis C Screening  Never done   Colonoscopy  Never done   DEXA SCAN  Never done   Diabetic kidney evaluation - eGFR measurement  06/14/2021   COVID-19 Vaccine (5 - 2023-24 season) 06/28/2022   Zoster Vaccines- Shingrix (2 of 2) 01/02/2023   HEMOGLOBIN A1C  04/11/2023   INFLUENZA VACCINE  05/29/2023   Pneumonia Vaccine 41+ Years old (1 of 1 - PCV) 01/27/2024 (Originally 12/30/2013)   Diabetic kidney evaluation - Urine ACR  10/12/2023   OPHTHALMOLOGY EXAM  11/30/2023   Medicare Annual Wellness (AWV)   12/20/2023   FOOT EXAM  06/09/2024   MAMMOGRAM  12/19/2024   HPV VACCINES  Aged Out   DTaP/Tdap/Td  Discontinued     ----------------------------------------------------------------------------------------------------------------------------------------------------------------------------------------------------------------- Physical Exam BP 109/67 (BP Location: Left Arm, Patient Position: Sitting, Cuff Size: Large)   Pulse 70   Ht 5' (1.524 m)   Wt 209 lb (94.8 kg)   SpO2 96%   BMI 40.82 kg/m   Physical Exam Constitutional:      Appearance: Normal appearance.  Eyes:     General: No scleral icterus. Cardiovascular:     Rate and Rhythm: Normal rate and regular rhythm.  Pulmonary:     Effort: Pulmonary effort is normal.     Breath sounds: Normal breath sounds.  Musculoskeletal:     Cervical back: Neck supple.  Neurological:     Mental Status: She is alert.  Psychiatric:        Mood and Affect: Mood normal.        Behavior: Behavior normal.     ------------------------------------------------------------------------------------------------------------------------------------------------------------------------------------------------------------------- Assessment and Plan  Colon cancer screening declined Discussed risks of non-screening for colon cancer.  She declines.    Type 2 diabetes mellitus without complication, with long-term current use of insulin (HCC) Fairly well controlled.  This is managed by endocrinology.  Overall doing well with current medications.   Hyperlipidemia associated with type 2 diabetes mellitus (HCC) Updated lipid panel ordered.  Tolerating atorvastatin well.   Essential hypertension BP remains well controlled.  Continue current medications.   History of ischemic stroke Remains on ASA and plavix.  Continue atorvastatin and management of additional risk factors.    No orders of the defined types were placed in this encounter.   No  follow-ups on file.    This visit occurred during the SARS-CoV-2 public health emergency.  Safety protocols were in place, including screening questions prior to the visit, additional usage of staff PPE, and extensive cleaning of exam room while observing appropriate contact time as indicated for disinfecting solutions.

## 2023-06-10 NOTE — Assessment & Plan Note (Signed)
Discussed risks of non-screening for colon cancer.  She declines.

## 2023-06-10 NOTE — Assessment & Plan Note (Signed)
Remains on ASA and plavix.  Continue atorvastatin and management of additional risk factors.

## 2023-06-10 NOTE — Assessment & Plan Note (Signed)
BP remains well controlled.  Continue current medications.  

## 2023-06-10 NOTE — Addendum Note (Signed)
Addended by: Mammie Lorenzo on: 06/10/2023 09:48 AM   Modules accepted: Orders

## 2023-06-10 NOTE — Assessment & Plan Note (Signed)
Fairly well controlled.  This is managed by endocrinology.  Overall doing well with current medications.

## 2023-06-10 NOTE — Assessment & Plan Note (Signed)
Updated lipid panel ordered.  Tolerating atorvastatin well.

## 2023-06-12 ENCOUNTER — Other Ambulatory Visit: Payer: Self-pay | Admitting: Family Medicine

## 2023-06-12 DIAGNOSIS — I10 Essential (primary) hypertension: Secondary | ICD-10-CM

## 2023-06-28 ENCOUNTER — Other Ambulatory Visit: Payer: Self-pay | Admitting: Family Medicine

## 2023-07-02 ENCOUNTER — Other Ambulatory Visit: Payer: Self-pay | Admitting: Family Medicine

## 2023-07-05 ENCOUNTER — Other Ambulatory Visit: Payer: Self-pay | Admitting: Family Medicine

## 2023-07-05 DIAGNOSIS — E1169 Type 2 diabetes mellitus with other specified complication: Secondary | ICD-10-CM

## 2023-07-10 ENCOUNTER — Encounter: Payer: Self-pay | Admitting: Podiatry

## 2023-07-10 ENCOUNTER — Ambulatory Visit (INDEPENDENT_AMBULATORY_CARE_PROVIDER_SITE_OTHER): Payer: MEDICARE | Admitting: Podiatry

## 2023-07-10 DIAGNOSIS — Z794 Long term (current) use of insulin: Secondary | ICD-10-CM | POA: Diagnosis not present

## 2023-07-10 DIAGNOSIS — E1142 Type 2 diabetes mellitus with diabetic polyneuropathy: Secondary | ICD-10-CM

## 2023-07-10 DIAGNOSIS — M79674 Pain in right toe(s): Secondary | ICD-10-CM

## 2023-07-10 DIAGNOSIS — M79675 Pain in left toe(s): Secondary | ICD-10-CM | POA: Diagnosis not present

## 2023-07-10 DIAGNOSIS — B351 Tinea unguium: Secondary | ICD-10-CM

## 2023-07-10 NOTE — Progress Notes (Signed)
  Subjective:  Patient ID: Adriana Gibson, female    DOB: 1951-10-05,   MRN: [id number]  Chief Complaint  Patient presents with   Nail Problem    Pt present for rfc.     72 y.o. female presents for concern of thickened elongated and painful nails that are difficult to trim. Requesting to have them trimmed today. Relates burning and tingling in their feet. Patient is diabetic and last A1c was  Lab Results  Component Value Date   HGBA1C 5.9 04/14/2023   .   PCP:  Everrett Coombe, DO    . Denies any other pedal complaints. Denies n/v/f/c.   Past Medical History:  Diagnosis Date   Diabetes mellitus (HCC) 06/14/2020   Essential hypertension 06/14/2020   Gastroesophageal reflux disease 06/14/2020   History of hysterectomy for benign disease 06/14/2020   Hyperlipidemia associated with type 2 diabetes mellitus (HCC) 06/14/2020   Hypothyroidism 06/14/2020    Objective:  Physical Exam: Vascular: DP/PT pulses 2/4 bilateral. CFT <3 seconds. Absent hair growth on digits. Edema noted to bilateral lower extremities. Xerosis noted bilaterally.  Skin. No lacerations or abrasions bilateral feet. Nails 1-5 bilateral  are thickened discolored and elongated with subungual debris.   Musculoskeletal: MMT 5/5 bilateral lower extremities in DF, PF, Inversion and Eversion. Deceased ROM in DF of ankle joint.  Neurological: Sensation intact to light touch. Protective sensation diminished bilateral.    Assessment:   1. Pain due to onychomycosis of toenails of both feet   2. Type 2 diabetes mellitus with diabetic polyneuropathy, with long-term current use of insulin (HCC)        Plan:  Patient was evaluated and treated and all questions answered. -Discussed and educated patient on diabetic foot care, especially with  regards to the vascular, neurological and musculoskeletal systems.  -Stressed the importance of good glycemic control and the detriment of not  controlling glucose levels in relation to the  foot. -Discussed supportive shoes at all times and checking feet regularly.  -Mechanically debrided all nails 1-5 bilateral using sterile nail nipper and filed with dremel without incident  -Answered all patient questions -Patient to return  in 4 months for at risk foot care -Patient advised to call the office if any problems or questions arise in the meantime.   Louann Sjogren, DPM

## 2023-08-03 ENCOUNTER — Other Ambulatory Visit: Payer: Self-pay | Admitting: Family Medicine

## 2023-09-28 ENCOUNTER — Other Ambulatory Visit: Payer: Self-pay | Admitting: Family Medicine

## 2023-09-28 DIAGNOSIS — I10 Essential (primary) hypertension: Secondary | ICD-10-CM

## 2023-10-02 ENCOUNTER — Other Ambulatory Visit: Payer: Self-pay | Admitting: Family Medicine

## 2023-10-09 ENCOUNTER — Ambulatory Visit: Payer: MEDICARE | Admitting: Podiatry

## 2023-10-16 LAB — HEMOGLOBIN A1C: Hemoglobin A1C: 5.8

## 2023-11-09 ENCOUNTER — Other Ambulatory Visit: Payer: Self-pay | Admitting: Family Medicine

## 2023-11-22 ENCOUNTER — Other Ambulatory Visit: Payer: Self-pay | Admitting: Family Medicine

## 2023-12-11 ENCOUNTER — Encounter: Payer: Self-pay | Admitting: Family Medicine

## 2023-12-11 ENCOUNTER — Ambulatory Visit (INDEPENDENT_AMBULATORY_CARE_PROVIDER_SITE_OTHER): Payer: MEDICARE | Admitting: Family Medicine

## 2023-12-11 VITALS — BP 111/69 | HR 80 | Ht 63.0 in | Wt 202.0 lb

## 2023-12-11 DIAGNOSIS — E1121 Type 2 diabetes mellitus with diabetic nephropathy: Secondary | ICD-10-CM | POA: Diagnosis not present

## 2023-12-11 DIAGNOSIS — E039 Hypothyroidism, unspecified: Secondary | ICD-10-CM

## 2023-12-11 DIAGNOSIS — I10 Essential (primary) hypertension: Secondary | ICD-10-CM

## 2023-12-11 DIAGNOSIS — E1142 Type 2 diabetes mellitus with diabetic polyneuropathy: Secondary | ICD-10-CM | POA: Diagnosis not present

## 2023-12-11 DIAGNOSIS — E785 Hyperlipidemia, unspecified: Secondary | ICD-10-CM

## 2023-12-11 DIAGNOSIS — Z1211 Encounter for screening for malignant neoplasm of colon: Secondary | ICD-10-CM

## 2023-12-11 DIAGNOSIS — Z8673 Personal history of transient ischemic attack (TIA), and cerebral infarction without residual deficits: Secondary | ICD-10-CM

## 2023-12-11 DIAGNOSIS — E1169 Type 2 diabetes mellitus with other specified complication: Secondary | ICD-10-CM

## 2023-12-11 DIAGNOSIS — Z780 Asymptomatic menopausal state: Secondary | ICD-10-CM

## 2023-12-11 DIAGNOSIS — N184 Chronic kidney disease, stage 4 (severe): Secondary | ICD-10-CM

## 2023-12-11 DIAGNOSIS — Z5320 Procedure and treatment not carried out because of patient's decision for unspecified reasons: Secondary | ICD-10-CM

## 2023-12-11 DIAGNOSIS — Z794 Long term (current) use of insulin: Secondary | ICD-10-CM

## 2023-12-11 DIAGNOSIS — Z23 Encounter for immunization: Secondary | ICD-10-CM | POA: Diagnosis not present

## 2023-12-11 NOTE — Assessment & Plan Note (Signed)
Followed by nephrology at this time.  Renal function stable.

## 2023-12-11 NOTE — Addendum Note (Signed)
Addended by: Ardyth Man on: 12/11/2023 09:52 AM   Modules accepted: Orders

## 2023-12-11 NOTE — Assessment & Plan Note (Signed)
Fairly well controlled.  This is managed by endocrinology.  Overall doing well with current medications.

## 2023-12-11 NOTE — Assessment & Plan Note (Signed)
Doing well with levothyroxine at current strength.  Will continue

## 2023-12-11 NOTE — Assessment & Plan Note (Signed)
Discussed risks of non-screening for colon cancer.  She declines.

## 2023-12-11 NOTE — Assessment & Plan Note (Signed)
Updated lipid panel ordered.  Tolerating atorvastatin well.

## 2023-12-11 NOTE — Progress Notes (Signed)
Adriana Gibson - 73 y.o. female MRN [id number]  Date of birth: [birth year]  Subjective Chief Complaint  Patient presents with   Hypertension    HPI Adriana Gibson is a 73 y.o. female here today for follow up visit.   She continues on lisinopril, metoprolol and hydralazine for management of HTN.  BP is well controlled.  She denies chest pain, shortness of breath, palpitations, headache or vision changes.    She continues to see endocrinology for management of diabetes.  A1c in December was 5.8%.  She has not had any lows with current medications.    Doing well with current strength of levothyroxine.   Remains on plavix and asa due to history of CVA and recurrent TIA.  Tolerating lipitor well for management of cholesterol.   ROS:  A comprehensive ROS was completed and negative except as noted per HPI  No Known Allergies  Past Medical History:  Diagnosis Date   Diabetes mellitus (HCC) 06/14/2020   Essential hypertension 06/14/2020   Gastroesophageal reflux disease 06/14/2020   History of hysterectomy for benign disease 06/14/2020   Hyperlipidemia associated with type 2 diabetes mellitus (HCC) 06/14/2020   Hypothyroidism 06/14/2020    Past Surgical History:  Procedure Laterality Date   ABDOMINAL HYSTERECTOMY     CESAREAN SECTION      Social History   Socioeconomic History   Marital status: Married    Spouse name: Donja Tipping Sr.   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 Enjoys watching t.v.   Social Drivers of  Health   Financial Resource Strain: Low Risk  (07/14/2023)   [facility]   Overall Financial Resource Strain (CARDIA)    Difficulty of Paying Living Expenses: Not very hard  Food Insecurity: No Food Insecurity (07/14/2023)   [facility]   Hunger Vital Sign    Worried About Running Out of Food in the Last Year: Never true    Ran Out of Food in the Last Year: Never true  Transportation Needs: No Transportation Needs (07/14/2023)   Received from [facility] - Transportation    Lack of Transportation (Medical): No    Lack of Transportation (Non-Medical): No  Physical Activity: Unknown (07/14/2023)   [facility]   Exercise Vital Sign    Days of Exercise per Week: 0 days    Minutes of Exercise per Session: Not on file  Stress: Stress Concern Present (07/14/2023)   Received from [facility] of Occupational Health - Occupational Stress Questionnaire    Feeling of Stress : To some extent  Social Connections: Somewhat Isolated (07/14/2023)   [facility] from [facility]   Social Network    How would you rate your social network (family, work, friends)?: Restricted participation with some degree of social isolation    Family History  Problem Relation Age of Onset   Cancer Mother  throat   Cancer Father    Heart disease Brother     Health Maintenance  Topic Date Due   Hepatitis C Screening  Never done   Colonoscopy  Never done   DEXA SCAN  Never done   OPHTHALMOLOGY EXAM  11/30/2023   Medicare Annual Wellness (AWV)  12/20/2023   Pneumonia Vaccine 9+ Years old (1 of 2 - PCV) 01/27/2024 (Originally 12/31/1956)   COVID-19 Vaccine (6 - 2024-25 season) 12/26/2024 (Originally 10/02/2023)   HEMOGLOBIN A1C  04/15/2024   Diabetic kidney evaluation - eGFR measurement  06/09/2024   Diabetic kidney evaluation - Urine ACR  06/09/2024   FOOT EXAM  06/09/2024   MAMMOGRAM  12/19/2024   INFLUENZA VACCINE  Completed    Zoster Vaccines- Shingrix  Completed   HPV VACCINES  Aged Out   DTaP/Tdap/Td  Discontinued     ----------------------------------------------------------------------------------------------------------------------------------------------------------------------------------------------------------------- Physical Exam BP 111/69 (BP Location: Left Arm, Patient Position: Sitting, Cuff Size: Large)   Pulse 80   Ht 5\' 3"  (1.6 m)   Wt 202 lb (91.6 kg)   SpO2 96%   BMI 35.78 kg/m   Physical Exam Constitutional:      Appearance: Normal appearance.  Eyes:     General: No scleral icterus. Cardiovascular:     Rate and Rhythm: Normal rate and regular rhythm.  Pulmonary:     Effort: Pulmonary effort is normal.     Breath sounds: Normal breath sounds.  Neurological:     Mental Status: She is alert.  Psychiatric:        Mood and Affect: Mood normal.        Behavior: Behavior normal.     ------------------------------------------------------------------------------------------------------------------------------------------------------------------------------------------------------------------- Assessment and Plan  Type 2 diabetes with nephropathy (HCC) Fairly well controlled.  This is managed by endocrinology.  Overall doing well with current medications.   Hypothyroidism Doing well with levothyroxine at current strength.  Will continue   Hyperlipidemia associated with type 2 diabetes mellitus (HCC) Updated lipid panel ordered.  Tolerating atorvastatin well.   Essential hypertension BP remains well controlled.  Continue current medications.   Chronic kidney disease, stage 4 (severe) (HCC) Followed by nephrology at this time.  Renal function stable.  Colon cancer screening declined Discussed risks of non-screening for colon cancer.  She declines.     No orders of the defined types were placed in this encounter.   No follow-ups on file.    This visit occurred during  the SARS-CoV-2 public health emergency.  Safety protocols were in place, including screening questions prior to the visit, additional usage of staff PPE, and extensive cleaning of exam room while observing appropriate contact time as indicated for disinfecting solutions.

## 2023-12-11 NOTE — Assessment & Plan Note (Signed)
BP remains well controlled.  Continue current medications.

## 2023-12-15 ENCOUNTER — Ambulatory Visit (INDEPENDENT_AMBULATORY_CARE_PROVIDER_SITE_OTHER): Payer: MEDICARE

## 2023-12-15 VITALS — Ht 63.0 in | Wt 202.0 lb

## 2023-12-15 DIAGNOSIS — Z0000 Encounter for general adult medical examination without abnormal findings: Secondary | ICD-10-CM

## 2023-12-15 NOTE — Progress Notes (Signed)
Subjective:   Adriana Gibson is a 73 y.o. female who presents for Medicare Annual (Subsequent) preventive examination.  Visit Complete: Virtual I connected with  Madalynn Pickelsimer on 12/15/23 by a audio enabled telemedicine application and verified that I am speaking with the correct person using two identifiers.  Patient Location: Home  Provider Location: Office/Clinic  I discussed the limitations of evaluation and management by telemedicine. The patient expressed understanding and agreed to proceed.  Vital Signs: Because this visit was a virtual/telehealth visit, some criteria may be missing or patient reported. Any vitals not documented were not able to be obtained and vitals that have been documented are patient reported.  Patient Medicare AWV questionnaire was completed by the patient on 12/10/2023; I have confirmed that all information answered by patient is correct and no changes since this date.  Cardiac Risk Factors include: advanced age (>81men, >42 women);obesity (BMI >30kg/m2);diabetes mellitus;sedentary lifestyle;hypertension;family history of premature cardiovascular disease;dyslipidemia     Objective:    Today's Vitals   12/15/23 1354  Weight: 202 lb (91.6 kg)  Height: 5\' 3"  (1.6 m)   Body mass index is 35.78 kg/m.     12/15/2023    2:08 PM 12/09/2022    1:58 PM 12/03/2021    1:45 PM 10/30/2020    2:15 PM  Advanced Directives  Does Patient Have a Medical Advance Directive? No No No No  Would patient like information on creating a medical advance directive? No - Patient declined No - Patient declined No - Patient declined No - Patient declined    Current Medications (verified) Outpatient Encounter Medications as of 12/15/2023  Medication Sig   aspirin 81 MG EC tablet Take 81 mg by mouth daily. Swallow whole.   atorvastatin (LIPITOR) 80 MG tablet TAKE 1 TABLET BY MOUTH EVERY DAY   Cholecalciferol (VITAMIN D3) 50 MCG (2000 UT) capsule Take by mouth.   clopidogrel  (PLAVIX) 75 MG tablet TAKE 1 TABLET BY MOUTH EVERY DAY   famotidine (PEPCID) 20 MG tablet Take 1 tablet (20 mg total) by mouth daily.   hydrALAZINE (APRESOLINE) 50 MG tablet TAKE 3 TABLETS (150 MG TOTAL) BY MOUTH 2 (TWO) TIMES DAILY.   insulin glargine, 2 Unit Dial, (TOUJEO MAX SOLOSTAR) 300 UNIT/ML Solostar Pen Inject 30-100 Units into the skin daily. As directed by endocrinology (Patient taking differently: Inject 30-100 Units into the skin daily. As directed by endocrinology (30 units daily))   insulin lispro (HUMALOG KWIKPEN) 200 UNIT/ML KwikPen INJECT 20-15-20-0 PLUS SCALE MDD:70  per endocrinology (Patient taking differently: Twice a day 8-10 units with meals.)   Insulin Pen Needle (BD PEN NEEDLE NANO 2ND GEN) 32G X 4 MM MISC As directed w/ insulin   levothyroxine (SYNTHROID) 112 MCG tablet Take 1 tablet (112 mcg total) by mouth daily.   lisinopril (ZESTRIL) 40 MG tablet TAKE 1 TABLET BY MOUTH EVERY DAY   metoprolol tartrate (LOPRESSOR) 50 MG tablet TAKE 1 TABLET BY MOUTH TWICE A DAY   ONETOUCH VERIO test strip 3 (three) times daily.   OZEMPIC, 1 MG/DOSE, 4 MG/3ML SOPN Inject 1 mg into the skin once a week.   pioglitazone (ACTOS) 15 MG tablet Take 1 tablet (15 mg total) by mouth daily.   spironolactone (ALDACTONE) 50 MG tablet Take 1 tablet (50 mg total) by mouth daily.   No facility-administered encounter medications on file as of 12/15/2023.    Allergies (verified) Patient has no known allergies.   History: Past Medical History:  Diagnosis Date   Diabetes  mellitus (HCC) 06/14/2020   Essential hypertension 06/14/2020   Gastroesophageal reflux disease 06/14/2020   History of hysterectomy for benign disease 06/14/2020   Hyperlipidemia associated with type 2 diabetes mellitus (HCC) 06/14/2020   Hypothyroidism 06/14/2020   Past Surgical History:  Procedure Laterality Date   ABDOMINAL HYSTERECTOMY     CESAREAN SECTION     Family History  Problem Relation Age of Onset   Cancer Mother         throat   Cancer Father    Heart disease Brother    Social History   Socioeconomic History   Marital status: Married    Spouse name: Beverlyann Broxterman Sr.   Number of children: 4   Years of education: 12   Highest education level: 12th grade  Occupational History   Occupation: retired    Comment: worked at a Animal nutritionist  Tobacco Use   Smoking status: Never   Smokeless tobacco: Never  Vaping Use   Vaping status: Never Used  Substance and Sexual Activity   Alcohol use: Never   Drug use: Never   Sexual activity: Not Currently    Partners: Male  Other Topics Concern   Not on file  Social History Narrative   Lives with her husband. Enjoys watching t.v.   Social Drivers of Health   Financial Resource Strain: Low Risk  (12/15/2023)   Overall Financial Resource Strain (CARDIA)    Difficulty of Paying Living Expenses: Not hard at all  Food Insecurity: No Food Insecurity (12/15/2023)   Hunger Vital Sign    Worried About Running Out of Food in the Last Year: Never true    Ran Out of Food in the Last Year: Never true  Transportation Needs: No Transportation Needs (12/15/2023)   PRAPARE - Administrator, Civil Service (Medical): No    Lack of Transportation (Non-Medical): No  Physical Activity: Inactive (12/15/2023)   Exercise Vital Sign    Days of Exercise per Week: 0 days    Minutes of Exercise per Session: 0 min  Stress: No Stress Concern Present (12/15/2023)   [employer] of Occupational Health - Occupational Stress Questionnaire    Feeling of Stress : Not at all  Social Connections: Moderately Integrated (12/15/2023)   Social Connection and Isolation Panel [NHANES]    Frequency of Communication with Friends and Family: More than three times a week    Frequency of Social Gatherings with Friends and Family: Once a week    Attends Religious Services: Never    Database administrator or Organizations: Yes    Attends Banker  Meetings: Never    Marital Status: Married    Tobacco Counseling Counseling given: Not Answered   Clinical Intake:  Pre-visit preparation completed: Yes  Pain : No/denies pain     BMI - recorded: 35.78 Nutritional Status: BMI > 30  Obese Nutritional Risks: None Diabetes: Yes CBG done?: Yes (96 mg/dl) CBG resulted in Enter/ Edit results?: No Did pt. bring in CBG monitor from home?: No  How often do you need to have someone help you when you read instructions, pamphlets, or other written materials from your doctor or pharmacy?: 1 - Never What is the last grade level you completed in school?: 12  Interpreter Needed?: No      Activities of Daily Living    12/15/2023    1:57 PM  In your present state of health, do you have any difficulty performing the following activities:  Hearing? 0  Vision? 0  Difficulty concentrating or making decisions? 0  Walking or climbing stairs? 0  Dressing or bathing? 0  Doing errands, shopping? 0  Preparing Food and eating ? N  Using the Toilet? N  In the past six months, have you accidently leaked urine? N  Do you have problems with loss of bowel control? N  Managing your Medications? N  Managing your Finances? N  Housekeeping or managing your Housekeeping? N    Patient Care Team: Everrett Coombe, DO as PCP - General (Family Medicine) Ricki Miller Rodman Key, NP as Nurse Practitioner (Endocrinology) Adegoroye, Charlies Silvers, MD (Nephrology) Louann Sjogren, DPM as Consulting Physician (Podiatry)  Indicate any recent Medical Services you may have received from other than [facility] providers in the past year (date may be approximate).     Assessment:   This is a routine wellness examination for Adriana Gibson.  Hearing/Vision screen Hearing Screening - Comments:: Unable to test Vision Screening - Comments:: Unable to test   Goals Addressed             This Visit's Progress    Activity and Exercise Increased       She would like to  maintain activity level.       Depression Screen    12/15/2023    2:07 PM 12/11/2023    8:54 AM 06/10/2023    9:28 AM 12/09/2022    1:58 PM 12/05/2022   10:13 AM 06/04/2022   11:12 AM 12/03/2021    1:45 PM  PHQ 2/9 Scores  PHQ - 2 Score 0 0 0 0 0 3 1  PHQ- 9 Score      14     Fall Risk    12/15/2023    2:09 PM 12/11/2023    8:54 AM 06/10/2023    9:28 AM 12/09/2022    1:58 PM 12/05/2022   10:13 AM  Fall Risk   Falls in the past year? 0 0 0 0 0  Number falls in past yr: 0 0 0 0 0  Injury with Fall? 0 0 0 0 0  Risk for fall due to : No Fall Risks No Fall Risks Impaired balance/gait No Fall Risks No Fall Risks  Follow up Falls evaluation completed Falls evaluation completed Falls evaluation completed Falls evaluation completed Falls evaluation completed    MEDICARE RISK AT HOME: Medicare Risk at Home Any stairs in or around the home?: Yes If so, are there any without handrails?: Yes Home free of loose throw rugs in walkways, pet beds, electrical cords, etc?: Yes Adequate lighting in your home to reduce risk of falls?: Yes Life alert?: No Use of a cane, walker or w/c?: No Grab bars in the bathroom?: Yes Shower chair or bench in shower?: Yes Elevated toilet seat or a handicapped toilet?: No  TIMED UP AND GO:  Was the test performed?  No    Cognitive Function:        12/15/2023    2:11 PM 12/09/2022    2:03 PM 12/03/2021    2:00 PM 10/30/2020    2:18 PM  6CIT Screen  What Year? 0 points 0 points 0 points 0 points  What month? 0 points 0 points 0 points 0 points  What time? 0 points 0 points 0 points 0 points  Count back from 20 0 points 0 points 0 points 0 points  Months in reverse 0 points 0 points 0 points 0 points  Repeat phrase 2 points 0 points 0  points 2 points  Total Score 2 points 0 points 0 points 2 points    Immunizations Immunization History  Administered Date(s) Administered   Influenza, Mdck, Trivalent,PF 6+ MOS(egg free) 07/17/2023   Influenza-Unspecified  08/22/2020, 10/11/2021, 11/07/2022   Moderna Covid-19 Vaccine Bivalent Booster 90yrs & up 10/11/2021   Moderna Sars-Covid-2 Vaccination 10/11/2021   PFIZER(Purple Top)SARS-COV-2 Vaccination 12/27/2019, 01/26/2020, 08/22/2020   PNEUMOCOCCAL CONJUGATE-20 12/11/2023   Pfizer(Comirnaty)Fall Seasonal Vaccine 12 years and older 08/07/2023   Respiratory Syncytial Virus Vaccine,Recomb Aduvanted(Arexvy) 11/07/2022   Zoster Recombinant(Shingrix) 11/07/2022, 01/15/2023    TDAP status: Due, Education has been provided regarding the importance of this vaccine. Advised may receive this vaccine at local pharmacy or Health Dept. Aware to provide a copy of the vaccination record if obtained from local pharmacy or Health Dept. Verbalized acceptance and understanding.  Flu Vaccine status: Up to date  Pneumococcal vaccine status: Up to date  Covid-19 vaccine status: Completed vaccines  Qualifies for Shingles Vaccine? Yes   Zostavax completed No   Shingrix Completed?: Yes  Screening Tests Health Maintenance  Topic Date Due   Hepatitis C Screening  Never done   Colonoscopy  Never done   DEXA SCAN  Never done   OPHTHALMOLOGY EXAM  11/30/2023   COVID-19 Vaccine (6 - 2024-25 season) 12/26/2024 (Originally 10/02/2023)   HEMOGLOBIN A1C  04/15/2024   Diabetic kidney evaluation - eGFR measurement  06/09/2024   Diabetic kidney evaluation - Urine ACR  06/09/2024   FOOT EXAM  06/09/2024   Medicare Annual Wellness (AWV)  12/14/2024   MAMMOGRAM  12/19/2024   Pneumonia Vaccine 32+ Years old  Completed   INFLUENZA VACCINE  Completed   Zoster Vaccines- Shingrix  Completed   HPV VACCINES  Aged Out   DTaP/Tdap/Td  Discontinued    Health Maintenance  Health Maintenance Due  Topic Date Due   Hepatitis C Screening  Never done   Colonoscopy  Never done   DEXA SCAN  Never done   OPHTHALMOLOGY EXAM  11/30/2023    Colon cancer screening - patient declined.   Mammogram status: Completed 12/19/2022. Repeat  every year every 2 years  Bone Density status: Ordered na. Pt provided with contact info and advised to call to schedule appt. She is scheduled for one next week.   Lung Cancer Screening: (Low Dose CT Chest recommended if Age 65-80 years, 20 pack-year currently smoking OR have quit w/in 15years.) does not qualify.   Lung Cancer Screening Referral: n/a  Additional Screening:  Hepatitis C Screening: does qualify; Completed not completed.   Vision Screening: Recommended annual ophthalmology exams for early detection of glaucoma and other disorders of the eye. Is the patient up to date with their annual eye exam?  Yes  Who is the provider or what is the name of the office in which the patient attends annual eye exams? Dr Logan Bores If pt is not established with a provider, would they like to be referred to a provider to establish care?  N/a .   Dental Screening: Recommended annual dental exams for proper oral hygiene  Diabetic Foot Exam: Diabetic Foot Exam: Completed 06/10/2023  Community Resource Referral / Chronic Care Management: CRR required this visit?  No   CCM required this visit?  No     Plan:     I have personally reviewed and noted the following in the patient's chart:   Medical and social history Use of alcohol, tobacco or illicit drugs  Current medications and supplements including opioid prescriptions. Patient  is not currently taking opioid prescriptions. Functional ability and status Nutritional status Physical activity Advanced directives List of other physicians Hospitalizations, surgeries, and ER visits in previous 12 months Vitals Screenings to include cognitive, depression, and falls Referrals and appointments  In addition, I have reviewed and discussed with patient certain preventive protocols, quality metrics, and best practice recommendations. A written personalized care plan for preventive services as well as general preventive health recommendations were  provided to patient.     Esmond Harps, CMA   12/15/2023   After Visit Summary: (MyChart) Due to this being a telephonic visit, the after visit summary with patients personalized plan was offered to patient via MyChart   Nurse Notes:   Adriana Gibson is a 73 y.o. female patient of Sunnie Nielsen, DO who had a Medicare Annual Wellness Visit today via telephone. Adriana Gibson is Retired and lives with their spouse. She has 4 children. she reports that she is socially active and does interact with friends/family regularly. She is minimally physically active and enjoys watching television. She loves westerns.   Recommend T-dap at local pharmacy.   She has a bone density scheduled for next week.   I will send a fax for last diabetic eye exam. She does have her yearly appointment next week.

## 2023-12-15 NOTE — Patient Instructions (Signed)
Adriana Gibson , Thank you for taking time to come for your Medicare Wellness Visit. I appreciate your ongoing commitment to your health goals. Please review the following plan we discussed and let me know if I can assist you in the future.   These are the goals we discussed:  Goals       Activity and Exercise Increased      She would like to maintain activity level.       Patient Stated      10/30/2020 AWV Goal: Diabetes Management  Patient will maintain an A1C level below 8.0 Patient will not develop any diabetic foot complications Patient will not experience any hypoglycemic episodes over the next 3 months Patient will notify our office of any CBG readings outside of the provider recommended range by calling [phone] Patient will adhere to provider recommendations for diabetes management  Patient Self Management Activities take all medications as prescribed and report any negative side effects monitor and record blood sugar readings as directed adhere to a low carbohydrate diet that incorporates lean proteins, vegetables, whole grains, low glycemic fruits check feet daily noting any sores, cracks, injuries, or callous formations see PCP or podiatrist if she notices any changes in her legs, feet, or toenails Patient will visit PCP and have an A1C level checked every 3 to 6 months as directed  have a yearly eye exam to monitor for vascular changes associated with diabetes and will request that the report be sent to her pcp.  consult with her PCP regarding any changes in her health or new or worsening symptoms       Patient Stated (pt-stated)      12/03/2021 AWV Goal: Exercise for General Health  Patient will verbalize understanding of the benefits of increased physical activity: Exercising regularly is important. It will improve your overall fitness, flexibility, and endurance. Regular exercise also will improve your overall health. It can help you control your weight, reduce stress,  and improve your bone density. Over the next year, patient will increase physical activity as tolerated with a goal of at least 150 minutes of moderate physical activity per week.  You can tell that you are exercising at a moderate intensity if your heart starts beating faster and you start breathing faster but can still hold a conversation. Moderate-intensity exercise ideas include: Walking 1 mile (1.6 km) in about 15 minutes Biking Hiking Golfing Dancing Water aerobics Patient will verbalize understanding of everyday activities that increase physical activity by providing examples like the following: Yard work, such as: Insurance underwriter Gardening Washing windows or floors Patient will be able to explain general safety guidelines for exercising:  Before you start a new exercise program, talk with your health care provider. Do not exercise so much that you hurt yourself, feel dizzy, or get very short of breath. Wear comfortable clothes and wear shoes with good support. Drink plenty of water while you exercise to prevent dehydration or heat stroke. Work out until your breathing and your heartbeat get faster.       Patient Stated (pt-stated)      Patient would like to continue to loose weight about 40 lbs.        This is a list of the screening recommended for you and due dates:  Health Maintenance  Topic Date Due   Hepatitis C Screening  Never done   Colon Cancer Screening  Never done  DEXA scan (bone density measurement)  Never done   Eye exam for diabetics  11/30/2023   COVID-19 Vaccine (6 - 2024-25 season) 12/26/2024*   Hemoglobin A1C  04/15/2024   Yearly kidney function blood test for diabetes  06/09/2024   Yearly kidney health urinalysis for diabetes  06/09/2024   Complete foot exam   06/09/2024   Medicare Annual Wellness Visit  12/14/2024   Mammogram  12/19/2024   Pneumonia Vaccine   Completed   Flu Shot  Completed   Zoster (Shingles) Vaccine  Completed   HPV Vaccine  Aged Out   DTaP/Tdap/Td vaccine  Discontinued  *Topic was postponed. The date shown is not the original due date.

## 2023-12-24 ENCOUNTER — Ambulatory Visit: Payer: MEDICARE

## 2023-12-24 DIAGNOSIS — Z13820 Encounter for screening for osteoporosis: Secondary | ICD-10-CM | POA: Diagnosis not present

## 2023-12-24 DIAGNOSIS — Z780 Asymptomatic menopausal state: Secondary | ICD-10-CM

## 2023-12-25 ENCOUNTER — Other Ambulatory Visit: Payer: Self-pay | Admitting: Family Medicine

## 2023-12-26 ENCOUNTER — Encounter: Payer: Self-pay | Admitting: Family Medicine

## 2024-01-01 ENCOUNTER — Other Ambulatory Visit: Payer: Self-pay | Admitting: Family Medicine

## 2024-01-08 ENCOUNTER — Telehealth: Payer: Self-pay

## 2024-01-08 NOTE — Telephone Encounter (Signed)
 Patients spouse called about patients medication hydrALAZINE (APRESOLINE) 50 MG tablet (Expired). Patients spouse stated that patient previously took 1 tablet 2 times a day, and now patients prescription is for 3 tablets 2 times a day, patients spouse would like to clarify, please advise, thanks.

## 2024-02-19 ENCOUNTER — Other Ambulatory Visit: Payer: Self-pay | Admitting: Family Medicine

## 2024-02-19 DIAGNOSIS — I10 Essential (primary) hypertension: Secondary | ICD-10-CM

## 2024-03-25 ENCOUNTER — Other Ambulatory Visit: Payer: Self-pay | Admitting: Family Medicine

## 2024-03-25 DIAGNOSIS — I10 Essential (primary) hypertension: Secondary | ICD-10-CM

## 2024-04-10 ENCOUNTER — Other Ambulatory Visit: Payer: Self-pay | Admitting: Family Medicine

## 2024-06-09 ENCOUNTER — Encounter: Payer: Self-pay | Admitting: Family Medicine

## 2024-06-09 ENCOUNTER — Ambulatory Visit (INDEPENDENT_AMBULATORY_CARE_PROVIDER_SITE_OTHER): Payer: MEDICARE | Admitting: Family Medicine

## 2024-06-09 VITALS — BP 131/74 | HR 77 | Resp 20 | Ht 63.0 in | Wt 207.0 lb

## 2024-06-09 DIAGNOSIS — I10 Essential (primary) hypertension: Secondary | ICD-10-CM

## 2024-06-09 DIAGNOSIS — N184 Chronic kidney disease, stage 4 (severe): Secondary | ICD-10-CM

## 2024-06-09 DIAGNOSIS — E039 Hypothyroidism, unspecified: Secondary | ICD-10-CM | POA: Diagnosis not present

## 2024-06-09 DIAGNOSIS — E1169 Type 2 diabetes mellitus with other specified complication: Secondary | ICD-10-CM | POA: Diagnosis not present

## 2024-06-09 DIAGNOSIS — Z8673 Personal history of transient ischemic attack (TIA), and cerebral infarction without residual deficits: Secondary | ICD-10-CM

## 2024-06-09 DIAGNOSIS — E1121 Type 2 diabetes mellitus with diabetic nephropathy: Secondary | ICD-10-CM | POA: Diagnosis not present

## 2024-06-09 DIAGNOSIS — E559 Vitamin D deficiency, unspecified: Secondary | ICD-10-CM

## 2024-06-09 DIAGNOSIS — E785 Hyperlipidemia, unspecified: Secondary | ICD-10-CM

## 2024-06-09 LAB — POCT GLYCOSYLATED HEMOGLOBIN (HGB A1C): Hemoglobin A1C: 6.2 % — AB (ref 4.0–5.6)

## 2024-06-09 NOTE — Assessment & Plan Note (Signed)
 Doing well with levothyroxine  at current strength.  Will continue and update TSH

## 2024-06-09 NOTE — Assessment & Plan Note (Signed)
 Well controlled.  This is managed by endocrinology.  Overall doing well with current medications.

## 2024-06-09 NOTE — Progress Notes (Signed)
 Adriana Gibson - 73 y.o. female MRN [id number]  Date of birth: 1950-11-22  Subjective Chief Complaint  Patient presents with   Follow-up    Hypertension, Type 2 diabetes mellitus without complication, with long-term current use of insulin [facility])    HPI Adriana Gibson is a 73 y.o. female here today for follow up.  She reports that she is doing pretty well.   She continues to see endocrinology for management of diabetes.  Recent A1c of 6.2%.  She denies episodes of hypoglycemia with current medications.    She continues on lisinpril, metoprolol  and aldactone  for management of HTN.  BP is well controlled at this time.  She denies chest pain, shortness of breath, palpitations, headache or vision changes.    History of CVA and remains on plavix  and asa.  Doing well with current medications.  Tolerating atorvastatin  well at current strength.    Due for updated thyroid labs.  Feels ok with current levothyroxine  dosing.   ROS:  A comprehensive ROS was completed and negative except as noted per HPI  No Known Allergies  Past Medical History:  Diagnosis Date   Diabetes mellitus (HCC) 06/14/2020   Essential hypertension 06/14/2020   Gastroesophageal reflux disease 06/14/2020   History of hysterectomy for benign disease 06/14/2020   Hyperlipidemia associated with type 2 diabetes mellitus (HCC) 06/14/2020   Hypothyroidism 06/14/2020    Past Surgical History:  Procedure Laterality Date   ABDOMINAL HYSTERECTOMY     CESAREAN SECTION      Social History   Socioeconomic History   Marital status: Married    Spouse name: Emilianna Barlowe Sr.   Number of children: 4   Years of education: 12   Highest education level: 12th grade  Occupational History   Occupation: retired    Comment: worked at a [employer] company  Tobacco Use   Smoking status: Never   Smokeless tobacco: Never  Vaping Use   Vaping status: Never Used  Substance and Sexual Activity   Alcohol use: Never   Drug use:  Never   Sexual activity: Not Currently    Partners: Male  Other Topics Concern   Not on file  Social History Narrative   Lives with her husband. Enjoys watching t.v.   Social Drivers of Health   Financial Resource Strain: Low Risk  (01/26/2024)   Received from [facility]   Overall Financial Resource Strain (CARDIA)    Difficulty of Paying Living Expenses: Not very hard  Food Insecurity: No Food Insecurity (01/26/2024)   Received from [facility]   Hunger Vital Sign    Within the past 12 months, you worried that your food would run out before you got the money to buy more.: Never true    Within the past 12 months, the food you bought just didn't last and you didn't have money to get more.: Never true  Transportation Needs: No Transportation Needs (01/26/2024)   Received from [facility] - Transportation    Lack of Transportation (Medical): No    Lack of Transportation (Non-Medical): No  Physical Activity: Unknown (01/26/2024)   Received from [facility]   Exercise Vital Sign    On average, how many days per week do you engage in moderate to strenuous exercise (like a brisk walk)?: 0 days    Minutes of Exercise per Session: Not on file  Recent Concern: Physical Activity - Inactive (12/15/2023)   Exercise Vital Sign    Days of Exercise per Week:  0 days    Minutes of Exercise per Session: 0 min  Stress: No Stress Concern Present (01/26/2024)   Received from [facility] - Occupational Stress Questionnaire    Feeling of Stress : Not at all  Social Connections: Somewhat Isolated (01/26/2024)   Received from [facility]   Social Network    How would you rate your social network (family, work, friends)?: Restricted participation with some degree of social isolation    Family History  Problem Relation Age of Onset   Cancer Mother        throat   Cancer Father    Heart disease Brother     Health Maintenance  Topic  Date Due   Hepatitis C Screening  Never done   Colonoscopy  Never done   OPHTHALMOLOGY EXAM  11/30/2023   HEMOGLOBIN A1C  04/15/2024   Diabetic kidney evaluation - eGFR measurement  06/09/2024   Diabetic kidney evaluation - Urine ACR  06/09/2024   INFLUENZA VACCINE  05/28/2024   FOOT EXAM  06/09/2024   COVID-19 Vaccine (6 - Pfizer risk 2024-25 season) 12/26/2024 (Originally 02/05/2024)   Medicare Annual Wellness (AWV)  12/14/2024   Pneumococcal Vaccine: 50+ Years  Completed   DEXA SCAN  Completed   Zoster Vaccines- Shingrix  Completed   Hepatitis B Vaccines  Aged Out   HPV VACCINES  Aged Out   Meningococcal B Vaccine  Aged Out   DTaP/Tdap/Td  Discontinued     ----------------------------------------------------------------------------------------------------------------------------------------------------------------------------------------------------------------- Physical Exam BP 131/74 (BP Location: Right Arm, Patient Position: Sitting, Cuff Size: Normal)   Pulse 77   Resp 20   Ht 5' 3 (1.6 m)   Wt 207 lb (93.9 kg)   SpO2 97%   BMI 36.67 kg/m   Physical Exam Constitutional:      Appearance: Normal appearance.  Cardiovascular:     Rate and Rhythm: Normal rate and regular rhythm.  Pulmonary:     Effort: Pulmonary effort is normal.     Breath sounds: Normal breath sounds.  Neurological:     General: No focal deficit present.     Mental Status: She is alert.  Psychiatric:        Mood and Affect: Mood normal.        Behavior: Behavior normal.     ------------------------------------------------------------------------------------------------------------------------------------------------------------------------------------------------------------------- Assessment and Plan  Essential hypertension BP remains well controlled.  Continue current medications.   Hypothyroidism Doing well with levothyroxine  at current strength.  Will continue and update TSH  History  of ischemic stroke Remains on ASA and plavix .  Continue atorvastatin  and management of additional risk factors.   Type 2 diabetes with nephropathy (HCC) Well controlled.  This is managed by endocrinology.  Overall doing well with current medications.   Hyperlipidemia associated with type 2 diabetes mellitus (HCC) Updated lipid panel ordered.  Tolerating atorvastatin  well.   Chronic kidney disease, stage 4 (severe) (HCC) Followed by nephrology at this time.  Renal function stable.   No orders of the defined types were placed in this encounter.   No follow-ups on file.

## 2024-06-09 NOTE — Assessment & Plan Note (Signed)
 Updated lipid panel ordered.  Tolerating atorvastatin  well.

## 2024-06-09 NOTE — Assessment & Plan Note (Signed)
 BP remains well controlled.  Continue current medications.

## 2024-06-09 NOTE — Assessment & Plan Note (Signed)
 Followed by nephrology at this time.  Renal function stable.

## 2024-06-09 NOTE — Assessment & Plan Note (Signed)
 Remains on ASA and plavix .  Continue atorvastatin  and management of additional risk factors.

## 2024-06-10 LAB — CBC WITH DIFFERENTIAL/PLATELET
Basophils Absolute: 0.1 x10E3/uL (ref 0.0–0.2)
Basos: 1 %
EOS (ABSOLUTE): 0.3 x10E3/uL (ref 0.0–0.4)
Eos: 5 %
Hematocrit: 46.5 % (ref 34.0–46.6)
Hemoglobin: 14.5 g/dL (ref 11.1–15.9)
Immature Grans (Abs): 0.3 x10E3/uL — ABNORMAL HIGH (ref 0.0–0.1)
Immature Granulocytes: 4 %
Lymphocytes Absolute: 1.6 x10E3/uL (ref 0.7–3.1)
Lymphs: 22 %
MCH: 29.3 pg (ref 26.6–33.0)
MCHC: 31.2 g/dL — ABNORMAL LOW (ref 31.5–35.7)
MCV: 94 fL (ref 79–97)
Monocytes Absolute: 0.6 x10E3/uL (ref 0.1–0.9)
Monocytes: 9 %
Neutrophils Absolute: 4.3 x10E3/uL (ref 1.4–7.0)
Neutrophils: 59 %
Platelets: 317 x10E3/uL (ref 150–450)
RBC: 4.95 x10E6/uL (ref 3.77–5.28)
RDW: 13 % (ref 11.7–15.4)
WBC: 7.3 x10E3/uL (ref 3.4–10.8)

## 2024-06-10 LAB — LIPID PANEL WITH LDL/HDL RATIO
Cholesterol, Total: 98 mg/dL — ABNORMAL LOW (ref 100–199)
HDL: 43 mg/dL (ref >39)
LDL Chol Calc (NIH): 38 mg/dL (ref 0–99)
LDL/HDL Ratio: 0.9 ratio (ref 0.0–3.2)
Triglycerides: 88 mg/dL (ref 0–149)
VLDL Cholesterol Cal: 17 mg/dL (ref 5–40)

## 2024-06-10 LAB — CMP14+EGFR
ALT: 16 IU/L (ref 0–32)
AST: 18 IU/L (ref 0–40)
Albumin: 3.8 g/dL (ref 3.8–4.8)
Alkaline Phosphatase: 119 IU/L (ref 44–121)
BUN/Creatinine Ratio: 13 (ref 12–28)
BUN: 25 mg/dL (ref 8–27)
Bilirubin Total: 0.3 mg/dL (ref 0.0–1.2)
CO2: 21 mmol/L (ref 20–29)
Calcium: 11.1 mg/dL — ABNORMAL HIGH (ref 8.7–10.3)
Chloride: 99 mmol/L (ref 96–106)
Creatinine, Ser: 1.87 mg/dL — ABNORMAL HIGH (ref 0.57–1.00)
Globulin, Total: 3 g/dL (ref 1.5–4.5)
Glucose: 115 mg/dL — ABNORMAL HIGH (ref 70–99)
Potassium: 4.7 mmol/L (ref 3.5–5.2)
Sodium: 140 mmol/L (ref 134–144)
Total Protein: 6.8 g/dL (ref 6.0–8.5)
eGFR: 28 mL/min/1.73 — ABNORMAL LOW (ref >59)

## 2024-06-10 LAB — VITAMIN D 25 HYDROXY (VIT D DEFICIENCY, FRACTURES): Vit D, 25-Hydroxy: 36.8 ng/mL (ref 30.0–100.0)

## 2024-06-10 LAB — TSH: TSH: 9.99 u[IU]/mL — ABNORMAL HIGH (ref 0.450–4.500)

## 2024-06-11 LAB — SPECIMEN STATUS REPORT

## 2024-06-11 LAB — MICROALBUMIN / CREATININE URINE RATIO
Creatinine, Urine: 203.3 mg/dL
Microalb/Creat Ratio: 2 mg/g{creat} (ref 0–29)
Microalbumin, Urine: 4.9 ug/mL

## 2024-06-22 ENCOUNTER — Other Ambulatory Visit: Payer: Self-pay | Admitting: Family Medicine

## 2024-06-22 DIAGNOSIS — E1169 Type 2 diabetes mellitus with other specified complication: Secondary | ICD-10-CM

## 2024-06-22 DIAGNOSIS — K219 Gastro-esophageal reflux disease without esophagitis: Secondary | ICD-10-CM

## 2024-06-22 DIAGNOSIS — E039 Hypothyroidism, unspecified: Secondary | ICD-10-CM

## 2024-06-22 DIAGNOSIS — I10 Essential (primary) hypertension: Secondary | ICD-10-CM

## 2024-06-24 ENCOUNTER — Ambulatory Visit: Payer: Self-pay

## 2024-06-24 NOTE — Telephone Encounter (Signed)
 FYI Only or Action Required?: Action required by provider: clinical question for provider.  Patient was last seen in primary care on 06/09/2024 by Alvia Bring, DO.  Called Nurse Triage reporting Foot Pain.  Symptoms began several days ago.  Interventions attempted: Nothing.  Symptoms are: unchanged.  Triage Disposition: See PCP When Office is Open (Within 3 Days)  Patient/caregiver understands and will follow disposition?: No, wishes to speak with PCP  Copied from CRM [id number]. Topic: Clinical - Red Word Triage >> Jun 24, 2024 11:19 AM Susanna ORN wrote: Red Word that prompted transfer to Nurse Triage: Patient's husband, Helayne, calling in stating that patient saw Dr. Alvia a few weeks ago for poor circulation in right foot. States he was under the impression that Dr. Alvia was suppose to give her a prescription for something to help with the circulation but the pharmacy never got anything. He states she's still having problems with her right foot & it has become very sore. Reason for Disposition  [1] MODERATE pain (e.g., interferes with normal activities, limping) AND [2] present > 3 days  Answer Assessment - Initial Assessment Questions 1. ONSET: When did the pain start?      Started early August 2. LOCATION: Where is the pain located?      Right foot 3. PAIN: How bad is the pain?    (Scale 1-10; or mild, moderate, severe)     6 out of 10 4. WORK OR EXERCISE: Has there been any recent work or exercise that involved this part of the body?      no 5. CAUSE: What do you think is causing the foot pain?     Husband states patient was told that she has poor circulation to her right foot-foot has become more sore.  6. OTHER SYMPTOMS: Do you have any other symptoms? (e.g., leg pain, rash, fever, numbness)     No  Husband called with patient on the line. Patient saw PCP on 06/09/2024 and states they told the provider about the foot pain. Patient and husband states they  were under the impression that medication was going to be sent into the pharmacy because of the discomfort and poor circulation. Husband and patient are asking for recommendations from provider with the foot pain she is currently having. Patient states pain is better today than yesterday. Asking for a call back from office.  Protocols used: Foot Pain-A-AH

## 2024-06-25 ENCOUNTER — Ambulatory Visit: Payer: Self-pay | Admitting: Family Medicine

## 2024-06-25 DIAGNOSIS — E039 Hypothyroidism, unspecified: Secondary | ICD-10-CM

## 2024-06-25 MED ORDER — LEVOTHYROXINE SODIUM 125 MCG PO TABS
125.0000 ug | ORAL_TABLET | Freq: Every day | ORAL | 0 refills | Status: DC
Start: 2024-06-25 — End: 2024-09-20

## 2024-06-29 ENCOUNTER — Ambulatory Visit: Payer: Self-pay

## 2024-06-29 NOTE — Telephone Encounter (Signed)
 Patient scheduled for 06/30/2024 with Vermell Bologna, PA

## 2024-06-29 NOTE — Telephone Encounter (Signed)
 FYI Only or Action Required?: FYI only for provider.  Patient was last seen in primary care on 06/09/2024 by Alvia Bring, DO.  Called Nurse Triage reporting Ankle Injury.  Symptoms began a week ago.  Interventions attempted: OTC medications: naproxen and ibuprofen and Rest, hydration, or home remedies.  Symptoms are: unchanged.  Triage Disposition: See Physician Within 24 Hours  Patient/caregiver understands and will follow disposition?: Yes                             Copied from CRM #[id number]. Topic: Clinical - Red Word Triage >> Jun 29, 2024 12:50 PM Corin V wrote: [facility] that prompted transfer to Nurse Triage: Patient husband called in stating the right ankle sprain he called about on Friday is not better. He stated there is discoloration and swelling. Reason for Disposition  [1] MODERATE pain (e.g., interferes with normal activities, limping) AND [2] high-risk adult (e.g., age > 60 years, osteoporosis, chronic steroid use)  Answer Assessment - Initial Assessment Questions 1. MECHANISM: How did the injury happen? (e.g., twisting injury, direct blow)      States patient sustained an ankle sprain while getting out of the bed about a week ago  2. ONSET: When did the injury happen? (e.g., minutes or hours ago)      Last Monday 3. LOCATION: Where is the injury located?      Right foot and ankle 4. APPEARANCE of INJURY: What does the injury look like?      Dark discoloration towards sides of right foot  5. WEIGHT-BEARING: Can you put weight on that foot? Can you walk (four steps or more)?       Husband states patient cannot bear full weight on foot, states patient needs assistance with walking 6. SIZE: For cuts, bruises, or swelling, ask: How large is it? (e.g., inches or centimeters; entire joint)      Husband states swelling is mostly on top of right foot 7. PAIN: Is there pain? If Yes, ask: How bad is the pain?  What does it keep you  from doing? (Scale 0-10; or none, mild, moderate, severe)     Husband states pain is preventing patient from walking normally, husband estimates pain is 6-7 9. OTHER SYMPTOMS: Do you have any other symptoms?      Denies chest pain, denies difficulty breathing    This RN spoke to [place], patient's husband. Husband states patient does not have a phone at this time and would not be available for a call.  Protocols used: Ankle Injury-A-AH

## 2024-06-30 ENCOUNTER — Ambulatory Visit (INDEPENDENT_AMBULATORY_CARE_PROVIDER_SITE_OTHER): Payer: MEDICARE | Admitting: Physician Assistant

## 2024-06-30 ENCOUNTER — Ambulatory Visit (INDEPENDENT_AMBULATORY_CARE_PROVIDER_SITE_OTHER): Payer: MEDICARE

## 2024-06-30 ENCOUNTER — Ambulatory Visit: Payer: Self-pay | Admitting: Physician Assistant

## 2024-06-30 VITALS — BP 136/46 | HR 72 | Ht 63.0 in

## 2024-06-30 DIAGNOSIS — M25571 Pain in right ankle and joints of right foot: Secondary | ICD-10-CM | POA: Insufficient documentation

## 2024-06-30 DIAGNOSIS — S93491A Sprain of other ligament of right ankle, initial encounter: Secondary | ICD-10-CM | POA: Insufficient documentation

## 2024-06-30 DIAGNOSIS — S99911A Unspecified injury of right ankle, initial encounter: Secondary | ICD-10-CM

## 2024-06-30 NOTE — Patient Instructions (Signed)
 Get xray today.  Keep leg elevated when you can.  If no fracture after xray today then start exercises to strength ankle Wear cam boot  Follow up in 2-3 weeks with PCP

## 2024-06-30 NOTE — Progress Notes (Signed)
 You do have a small fracture of the calcaneus. You will need to be in boot for at least 4 weeks. Keep follow up with PCP and determine if you will need longer. Do not start exercises. Stay in boot and rest.

## 2024-06-30 NOTE — Progress Notes (Addendum)
   Acute Office Visit  Subjective:     Patient ID: Adriana Gibson, female    DOB: 1951-10-01, 73 y.o.   MRN: [id number]  Chief Complaint  Patient presents with   Ankle Injury    Right ankle pain    HPI Patient is in today for persistent right lateral ankle pain after rolling her ankle 9 days ago while standing up to go to bathroom. She has not been able to bear weight since. She has had bruising and swelling. The swelling is better. Pain is with ROM and putting weight on it. She is not taking anything for pain.    ROS See HPI.     Objective:    BP (!) 136/46   Pulse 72   Ht 5' 3 (1.6 m)   SpO2 99%   BMI 36.67 kg/m  BP Readings from Last 3 Encounters:  06/30/24 (!) 136/46  06/09/24 131/74  12/11/23 111/69   Wt Readings from Last 3 Encounters:  06/09/24 207 lb (93.9 kg)  12/15/23 202 lb (91.6 kg)  12/11/23 202 lb (91.6 kg)      Physical Exam Constitutional:      Appearance: She is obese.     Comments: In a wheelchair.   Musculoskeletal:     Comments: Right lateral ankle with bruising that extends into the dorsal foot but no swelling/warm/redness.  Pain with plantar and dorsi flexion as well as side to side movements worse to the left.  Pain to palpation more over calcinofibular ligament and ATFL and right over lateral malleolus.  In wheelchair today and not able to bear weight.           Assessment & Plan:  SABRASABRAArlita was seen today for ankle injury.  Diagnoses and all orders for this visit:  Injury of right ankle, initial encounter -     DG Ankle Complete Right; Future  Pain in lateral portion of right ankle -     DG Ankle Complete Right; Future  Sprain of anterior talofibular ligament of right ankle, initial encounter   Ankle sprain with question of avulsion fracture of lateral malleolus Will get xray today Pt placed in cam boot for next 2-3 weeks Ice ankle at least twice a day Start ankle strengthening exercises given today if no fracture on  xray Follow up with PCP in 2-3 weeks  Addendum:   Xray did show lateral calcaneous fracture. Called patient and told to stay in cam boot for at least 4 weeks. Her and PCP can discuss if she needs to stay longer at next visit. Do not start exercises given today.    Return if symptoms worsen or fail to improve, for 2-3 weeks with PCP.  Renai Lopata, PA-C

## 2024-07-10 ENCOUNTER — Other Ambulatory Visit: Payer: Self-pay | Admitting: Family Medicine

## 2024-07-17 ENCOUNTER — Other Ambulatory Visit: Payer: Self-pay | Admitting: Family Medicine

## 2024-09-18 ENCOUNTER — Other Ambulatory Visit: Payer: Self-pay | Admitting: Family Medicine

## 2024-09-18 DIAGNOSIS — I10 Essential (primary) hypertension: Secondary | ICD-10-CM

## 2024-09-19 ENCOUNTER — Other Ambulatory Visit: Payer: Self-pay | Admitting: Family Medicine

## 2024-09-19 DIAGNOSIS — E039 Hypothyroidism, unspecified: Secondary | ICD-10-CM

## 2024-10-07 ENCOUNTER — Other Ambulatory Visit: Payer: Self-pay | Admitting: Family Medicine

## 2024-12-20 ENCOUNTER — Ambulatory Visit: Payer: MEDICARE
